# Patient Record
Sex: Female | Born: 2000
Health system: Southern US, Community
[De-identification: ages and names within clinical notes are randomized; demographics above are authoritative.]

## PROBLEM LIST (undated history)

## (undated) ENCOUNTER — Inpatient Hospital Stay (HOSPITAL_COMMUNITY): Payer: Self-pay

## (undated) DIAGNOSIS — Z8774 Personal history of (corrected) congenital malformations of heart and circulatory system: Secondary | ICD-10-CM

## (undated) DIAGNOSIS — R011 Cardiac murmur, unspecified: Secondary | ICD-10-CM

## (undated) DIAGNOSIS — R519 Headache, unspecified: Secondary | ICD-10-CM

## (undated) DIAGNOSIS — E119 Type 2 diabetes mellitus without complications: Secondary | ICD-10-CM

## (undated) HISTORY — PX: VSD REPAIR: SHX276

## (undated) HISTORY — PX: HEART CHAMBER REVISION: SHX267

## (undated) HISTORY — DX: Type 2 diabetes mellitus without complications: E11.9

## (undated) HISTORY — PX: OVARIAN CYST REMOVAL: SHX89

## (undated) HISTORY — DX: Personal history of (corrected) congenital malformations of heart and circulatory system: Z87.74

---
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
## 2018-03-25 DIAGNOSIS — R112 Nausea with vomiting, unspecified: Secondary | ICD-10-CM | POA: Diagnosis not present

## 2018-03-25 DIAGNOSIS — R51 Headache: Secondary | ICD-10-CM | POA: Diagnosis not present

## 2018-06-12 DIAGNOSIS — J069 Acute upper respiratory infection, unspecified: Secondary | ICD-10-CM | POA: Diagnosis not present

## 2018-10-21 DIAGNOSIS — R51 Headache: Secondary | ICD-10-CM | POA: Diagnosis not present

## 2018-10-21 DIAGNOSIS — R42 Dizziness and giddiness: Secondary | ICD-10-CM | POA: Diagnosis not present

## 2019-05-06 DIAGNOSIS — N898 Other specified noninflammatory disorders of vagina: Secondary | ICD-10-CM | POA: Diagnosis not present

## 2019-06-20 DIAGNOSIS — G43909 Migraine, unspecified, not intractable, without status migrainosus: Secondary | ICD-10-CM | POA: Diagnosis not present

## 2019-07-24 DIAGNOSIS — N912 Amenorrhea, unspecified: Secondary | ICD-10-CM | POA: Diagnosis not present

## 2019-08-23 DIAGNOSIS — N912 Amenorrhea, unspecified: Secondary | ICD-10-CM | POA: Diagnosis not present

## 2019-08-23 DIAGNOSIS — Z3201 Encounter for pregnancy test, result positive: Secondary | ICD-10-CM | POA: Diagnosis not present

## 2019-08-23 DIAGNOSIS — N83201 Unspecified ovarian cyst, right side: Secondary | ICD-10-CM | POA: Diagnosis not present

## 2019-08-25 DIAGNOSIS — Z3201 Encounter for pregnancy test, result positive: Secondary | ICD-10-CM | POA: Diagnosis not present

## 2019-08-28 ENCOUNTER — Other Ambulatory Visit: Payer: Self-pay

## 2019-08-28 ENCOUNTER — Inpatient Hospital Stay (HOSPITAL_COMMUNITY): Payer: BC Managed Care – PPO

## 2019-08-28 ENCOUNTER — Encounter (HOSPITAL_COMMUNITY): Payer: Self-pay | Admitting: Obstetrics and Gynecology

## 2019-08-28 ENCOUNTER — Inpatient Hospital Stay (HOSPITAL_COMMUNITY)
Admission: AD | Admit: 2019-08-28 | Discharge: 2019-08-28 | Disposition: A | Discharge status: Home / Self Care | Payer: BC Managed Care – PPO | Attending: Obstetrics and Gynecology | Admitting: Obstetrics and Gynecology

## 2019-08-28 DIAGNOSIS — N83202 Unspecified ovarian cyst, left side: Secondary | ICD-10-CM | POA: Insufficient documentation

## 2019-08-28 DIAGNOSIS — Z3A00 Weeks of gestation of pregnancy not specified: Secondary | ICD-10-CM | POA: Diagnosis not present

## 2019-08-28 DIAGNOSIS — O3680X0 Pregnancy with inconclusive fetal viability, not applicable or unspecified: Secondary | ICD-10-CM | POA: Diagnosis not present

## 2019-08-28 DIAGNOSIS — O200 Threatened abortion: Secondary | ICD-10-CM | POA: Insufficient documentation

## 2019-08-28 DIAGNOSIS — Z3A01 Less than 8 weeks gestation of pregnancy: Secondary | ICD-10-CM | POA: Diagnosis not present

## 2019-08-28 DIAGNOSIS — O209 Hemorrhage in early pregnancy, unspecified: Secondary | ICD-10-CM | POA: Diagnosis not present

## 2019-08-28 DIAGNOSIS — O99891 Other specified diseases and conditions complicating pregnancy: Secondary | ICD-10-CM | POA: Diagnosis not present

## 2019-08-28 DIAGNOSIS — Z3A09 9 weeks gestation of pregnancy: Secondary | ICD-10-CM | POA: Diagnosis not present

## 2019-08-28 HISTORY — DX: Cardiac murmur, unspecified: R01.1

## 2019-08-28 HISTORY — DX: Headache, unspecified: R51.9

## 2019-08-28 NOTE — ED Provider Notes (Signed)
MSE was initiated and I personally evaluated the patient and placed orders (if any) at  2:50 PM on August 28, 2019.  Shirley Gutierrez is a 19 y.o. female, presenting to the ED with vaginal bleeding for the last 5 days in the setting of pregnancy.  She states she has been changing a pad a few times a day but only when she goes to use the bathroom.   Accompanied by nausea. LMP April 17.  Followed by [facility] here in [place]. Denies fever, abdominal pain, vomiting, diarrhea, urinary symptoms.   Physical Exam:  No diaphoresis.  No pallor.  Pulmonary: No increased work of breathing.  Speaks in full sentences without difficulty. No tachypnea.  Cardiac: Normal rate and regular.   Abdominal: No abdominal tenderness.  No peritoneal signs.  No rebound tenderness.  No guarding.     2:54 PM: Spoke with Misty Stanley, CNM at MAU.  Discussed patient's symptoms.  States patient can come to the MAU.  Vitals:   08/28/19 1452  BP: 138/73  Pulse: 92  Resp: 14  Temp: 98.2 F (36.8 C)  TempSrc: Oral  SpO2: 100%  Weight: 83.5 kg  Height: 5\' 1"  (1.549 m)     The patient appears stable so that the remainder of the MSE may be completed by another provider.   08/28/19 1514    08/30/19, MD 08/28/19 [phone]

## 2019-08-28 NOTE — Discharge Instructions (Signed)
Return to MAU with severe vaginal bleeding or severe abdominal pain.  Advised to return with worsening symptoms  Pelvic rest - no tampons, no douching, no sex, nothing in the vagina. No heavy lifting or strenuous exercise. See your doctor as scheduled.

## 2019-08-28 NOTE — MAU Provider Note (Signed)
History     CSN: [id number]  Arrival date and time: 08/28/19 1433   First Provider Initiated Contact with Patient 08/28/19 1550      Chief Complaint  Patient presents with   Vaginal Bleeding   HPI Shirley Gutierrez 19 y.o. [redacted]w[redacted]d Comes to MAU with brown bleeding for the past 5 days.   Gets her prenatal care in [place].  Partner lives in [place] and she attends college in [place].  Comes here today as the bleeding has worsened and she is concerned.  Labs checked on her phone - O positive blood type verified.    OB History    Gravida  1   Para      Term      Preterm      AB      Living        SAB      TAB      Ectopic      Multiple      Live Births              Past Medical History:  Diagnosis Date   Headache    Heart murmur    when she was a child      No family history on file.  Social History   Tobacco Use   Smoking status: Never Smoker   Smokeless tobacco: Never Used  Substance Use Topics   Alcohol use: Never   Drug use: Never    Allergies:  Allergies  Allergen Reactions   Peanut-Containing Drug Products Other (See Comments)    No medications prior to admission.    Review of Systems  Constitutional: Negative for fever.  Respiratory: Negative for cough, shortness of breath and wheezing.   Gastrointestinal: Negative for abdominal pain, nausea and vomiting.  Genitourinary: Positive for vaginal bleeding. Negative for dysuria and vaginal discharge.   Physical Exam   Blood pressure 126/87, pulse 90, temperature 98.6 F (37 C), temperature source Oral, resp. rate 16, height 5\' 1"  (1.549 m), weight 84.2 kg, last menstrual period 06/25/2019, SpO2 98 %.  Physical Exam  Nursing note and vitals reviewed. Constitutional: She is oriented to person, place, and time. She appears well-developed.  HENT:  Head: Normocephalic.  GI: Soft.  Musculoskeletal:        General: Normal range of motion.     Cervical back: Neck supple.   Neurological: She is alert and oriented to person, place, and time.  Skin: Skin is warm and dry.    MAU Course  Procedures CLINICAL DATA:  Vaginal bleeding.  EXAM: OBSTETRIC <14 WK 06/27/2019 AND TRANSVAGINAL OB US  TECHNIQUE: Both transabdominal and transvaginal ultrasound examinations were performed for complete evaluation of the gestation as well as the maternal uterus, adnexal regions, and pelvic cul-de-sac. Transvaginal technique was performed to assess early pregnancy.  COMPARISON:  None.  FINDINGS: Intrauterine gestational sac: Probable early gestational sac.  Yolk sac:  Not Visualized.  Embryo:  Not Visualized.  MSD: 7.43 mm mm   5 w   3 d  Subchorionic hemorrhage:  None visualized.  Maternal uterus/adnexae: Normal right ovary. There is a mass in the left adnexa which contains solid and cystic components measuring 7.4 x 5.7 x 6 cm. The mass is very heterogeneous in echotexture with regions of shadowing identified.  IMPRESSION: 1. A rounded fluid collection in the endometrium is favored to represent an early gestational sac. No yolk sac or fetal pole identified. Probable early intrauterine gestational sac, but no yolk  sac, fetal pole, or cardiac activity yet visualized. Recommend follow-up quantitative B-HCG levels and follow-up US in 14 days to assess viability. This recommendation follows SRU consensus guidelines: Diagnostic Criteria for Nonviable Pregnancy Early in the First Trimester. [place] Med 2013; 098:1191-47. 2. There is a heterogeneous solid and cystic mass in the left adnexa measuring 7.4 x 5.7 x 6 cm. The mass likely arises within the left ovary and a separate left ovary is not seen. This mass is favored to represent a dermoid given the heterogeneous appearance with probable fat and calcifications in the mass.  MDM Today's [place] results are similar to what client verbalized from her [place] in the office earlier this month.  Still no fetal  pole.  Assessment and Plan  Vaginal bleeding - early developing pregnancy vs pending miscarriage Ultrasound inconclusive Threatened miscarriage Ovarian cyst, left  Plan Follow up with provider in [place]. If bleeding worsens, likely will be a miscarriage O positive blood type  Virginia Rochester 08/28/2019, 5:12 PM

## 2019-08-28 NOTE — MAU Note (Signed)
Shirley Gutierrez is a 19 y.o. at [redacted]w[redacted]d here in MAU reporting: brown bleeding for the past 5 days and last night it was red and heavier. No pain or abnormal discharge.   Has been seen at [facility] and had hcg drawn on 08/23/2019 and it was 1009. Pt able to show this to RN via patient portal. Has had an u/s with gestational sac but states it was empty.  LMP: 06/25/19  Onset of complaint: ongoing  Pain score: 0/10  Vitals:   08/28/19 1452 08/28/19 1525  BP: 138/73 126/87  Pulse: 92 90  Resp: 14 16  Temp: 98.2 F (36.8 C) 98.6 F (37 C)  SpO2: 100% 98%     Lab orders placed from triage: UA, pt unable to give sample at this time

## 2019-08-29 ENCOUNTER — Other Ambulatory Visit: Payer: Self-pay

## 2019-08-29 ENCOUNTER — Encounter (HOSPITAL_COMMUNITY): Payer: Self-pay | Admitting: Obstetrics & Gynecology

## 2019-08-29 ENCOUNTER — Inpatient Hospital Stay (HOSPITAL_COMMUNITY): Payer: BC Managed Care – PPO

## 2019-08-29 ENCOUNTER — Inpatient Hospital Stay (HOSPITAL_COMMUNITY)
Admission: AD | Admit: 2019-08-29 | Discharge: 2019-08-29 | Disposition: A | Discharge status: Home / Self Care | Payer: BC Managed Care – PPO | Attending: Obstetrics & Gynecology | Admitting: Obstetrics & Gynecology

## 2019-08-29 DIAGNOSIS — N83209 Unspecified ovarian cyst, unspecified side: Secondary | ICD-10-CM

## 2019-08-29 DIAGNOSIS — N83202 Unspecified ovarian cyst, left side: Secondary | ICD-10-CM

## 2019-08-29 DIAGNOSIS — O039 Complete or unspecified spontaneous abortion without complication: Secondary | ICD-10-CM | POA: Diagnosis not present

## 2019-08-29 DIAGNOSIS — Z3A09 9 weeks gestation of pregnancy: Secondary | ICD-10-CM | POA: Diagnosis not present

## 2019-08-29 DIAGNOSIS — O26891 Other specified pregnancy related conditions, first trimester: Secondary | ICD-10-CM

## 2019-08-29 DIAGNOSIS — O209 Hemorrhage in early pregnancy, unspecified: Secondary | ICD-10-CM | POA: Diagnosis not present

## 2019-08-29 LAB — CBC WITH DIFFERENTIAL/PLATELET
Abs Immature Granulocytes: 0.04 10*3/uL (ref 0.00–0.07)
Basophils Absolute: 0 10*3/uL (ref 0.0–0.1)
Basophils Relative: 0 %
Eosinophils Absolute: 0.3 10*3/uL (ref 0.0–0.5)
Eosinophils Relative: 4 %
HCT: 39.7 % (ref 36.0–46.0)
Hemoglobin: 12.7 g/dL (ref 12.0–15.0)
Immature Granulocytes: 1 %
Lymphocytes Relative: 12 %
Lymphs Abs: 0.8 10*3/uL (ref 0.7–4.0)
MCH: 27.8 pg (ref 26.0–34.0)
MCHC: 32 g/dL (ref 30.0–36.0)
MCV: 86.9 fL (ref 80.0–100.0)
Monocytes Absolute: 0.4 10*3/uL (ref 0.1–1.0)
Monocytes Relative: 5 %
Neutro Abs: 5.5 10*3/uL (ref 1.7–7.7)
Neutrophils Relative %: 78 %
Platelets: 248 10*3/uL (ref 150–400)
RBC: 4.57 MIL/uL (ref 3.87–5.11)
RDW: 14.6 % (ref 11.5–15.5)
WBC: 7.1 10*3/uL (ref 4.0–10.5)
nRBC: 0 % (ref 0.0–0.2)

## 2019-08-29 LAB — ABO/RH: ABO/RH(D): O POS

## 2019-08-29 LAB — HCG, QUANTITATIVE, PREGNANCY: hCG, Beta Chain, Quant, S: 649 m[IU]/mL — ABNORMAL HIGH (ref <5)

## 2019-08-29 MED ORDER — TRAMADOL HCL 50 MG PO TABS: 50.0000 mg | ORAL_TABLET | Freq: Four times a day (QID) | ORAL | 0 refills | Status: AC | PRN

## 2019-08-29 MED ORDER — HYDROMORPHONE HCL 1 MG/ML IJ SOLN
1.0000 mg | Freq: Once | INTRAMUSCULAR | Status: AC
  Administered 2019-08-29: 1 mg via INTRAMUSCULAR
  Filled 2019-08-29: qty 1

## 2019-08-29 NOTE — MAU Provider Note (Signed)
Chief Complaint: Abdominal Pain and Vaginal Bleeding   First Provider Initiated Contact with Patient 08/29/19 2008     SUBJECTIVE HPI: Shirley Gutierrez is a 19 y.o. G1P0 at [redacted]w[redacted]d who presents to Maternity Admissions reporting abdominal pain and vaginal bleeding.  Was seen in MAU yesterday and returned today for worsening symptoms.  States she was having no pain yesterday but the pain started this morning.  Was receiving care in [place] and has records in her phone.  Had an hCG on June 15 of 1009, was drawn again on the 17th but does not have the results for that yet.  Had an ultrasound done on June 17 that showed an intrauterine gestational sac with a yolk sac and a large dermoid cyst on her right ovary.  States she has filled 2 pads today, is not saturating pads per hour and is not passing clots.  Location: abdomen Quality: cramping Severity: 8/10 on pain scale Duration: <1 day Timing: intermittent Modifying factors: none Associated signs and symptoms: vaginal bleeding  Past Medical History:  Diagnosis Date  . Headache   . Heart murmur    when she was a child   OB History  Gravida Para Term Preterm AB Living  1            SAB TAB Ectopic Multiple Live Births               # Outcome Date GA Lbr Len/2nd Weight Sex Delivery Anes PTL Lv  1 Current            Past Surgical History:  Procedure Laterality Date  . HEART CHAMBER REVISION     closed hole in heart when seh was 2 months old   Social History   Socioeconomic History  . Marital status: Soil scientist    Spouse name: Not on file  . Number of children: Not on file  . Years of education: Not on file  . Highest education level: Not on file  Occupational History  . Not on file  Tobacco Use  . Smoking status: Never Smoker  . Smokeless tobacco: Never Used  Substance and Sexual Activity  . Alcohol use: Never  . Drug use: Never  . Sexual activity: Not on file  Other Topics Concern  . Not on file  Social History  Narrative  . Not on file   Social Determinants of Health   Financial Resource Strain:   . Difficulty of Paying Living Expenses:   Food Insecurity:   . Worried About Charity fundraiser in the Last Year:   . Arboriculturist in the Last Year:   Transportation Needs:   . Film/video editor (Medical):   Marland Kitchen Lack of Transportation (Non-Medical):   Physical Activity:   . Days of Exercise per Week:   . Minutes of Exercise per Session:   Stress:   . Feeling of Stress :   Social Connections:   . Frequency of Communication with Friends and Family:   . Frequency of Social Gatherings with Friends and Family:   . Attends Religious Services:   . Active Member of Clubs or Organizations:   . Attends Archivist Meetings:   Marland Kitchen Marital Status:   Intimate Partner Violence:   . Fear of Current or Ex-Partner:   . Emotionally Abused:   Marland Kitchen Physically Abused:   . Sexually Abused:    History reviewed. No pertinent family history. No current facility-administered medications on file prior to encounter.  Current Outpatient Medications on File Prior to Encounter  Medication Sig Dispense Refill  . ondansetron (ZOFRAN-ODT) 4 MG disintegrating tablet Take by mouth.     Allergies  Allergen Reactions  . Peanut-Containing Drug Products Other (See Comments)    I have reviewed patient's Past Medical Hx, Surgical Hx, Family Hx, Social Hx, medications and allergies.   Review of Systems  Constitutional: Negative.   Gastrointestinal: Positive for abdominal pain.  Genitourinary: Positive for vaginal bleeding.    OBJECTIVE Patient Vitals for the past 24 hrs:  BP Temp Temp src Pulse Resp Height Weight  08/29/19 2236 (!) 128/54 -- -- 76 -- -- --  08/29/19 1950 135/64 98.7 F (37.1 C) Oral 74 20 5\' 1"  (1.549 m) 83.6 kg   Constitutional: Well-developed, well-nourished female in no acute distress.  Cardiovascular: normal rate & rhythm, no murmur Respiratory: normal rate and effort. Lung sounds  clear throughout GI: Abd soft, non-tender, Pos BS x 4. No guarding or rebound tenderness MS: Extremities nontender, no edema, normal ROM Neurologic: Alert and oriented x 4.      LAB RESULTS Results for orders placed or performed during the hospital encounter of 08/29/19 (from the past 24 hour(s))  CBC with Differential/Platelet     Status: None   Collection Time: 08/29/19  8:34 PM  Result Value Ref Range   WBC 7.1 4.0 - 10.5 K/uL   RBC 4.57 3.87 - 5.11 MIL/uL   Hemoglobin 12.7 12.0 - 15.0 g/dL   HCT 08/31/19 36 - 46 %   MCV 86.9 80.0 - 100.0 fL   MCH 27.8 26.0 - 34.0 pg   MCHC 32.0 30.0 - 36.0 g/dL   RDW 29.4 76.5 - 46.5 %   Platelets 248 150 - 400 K/uL   nRBC 0.0 0.0 - 0.2 %   Neutrophils Relative % 78 %   Neutro Abs 5.5 1.7 - 7.7 K/uL   Lymphocytes Relative 12 %   Lymphs Abs 0.8 0.7 - 4.0 K/uL   Monocytes Relative 5 %   Monocytes Absolute 0.4 0 - 1 K/uL   Eosinophils Relative 4 %   Eosinophils Absolute 0.3 0 - 0 K/uL   Basophils Relative 0 %   Basophils Absolute 0.0 0 - 0 K/uL   Immature Granulocytes 1 %   Abs Immature Granulocytes 0.04 0.00 - 0.07 K/uL  hCG, quantitative, pregnancy     Status: Abnormal   Collection Time: 08/29/19  8:34 PM  Result Value Ref Range   hCG, Beta Chain, Quant, S 649 (H) <5 mIU/mL  ABO/Rh     Status: None   Collection Time: 08/29/19  8:34 PM  Result Value Ref Range   ABO/RH(D) O POS    No rh immune globuloin      NOT A RH IMMUNE GLOBULIN CANDIDATE, PT RH POSITIVE Performed at [facility], [address] [place], [place]     IMAGING [id number] US Transvaginal  Result Date: 08/29/2019 CLINICAL DATA:  19 year old pregnant female with vaginal bleeding. LMP: 06/25/2019 corresponding to an estimated gestational age of [redacted] weeks, 2 days. EXAM: OBSTETRIC <14 WK 06/27/2019 AND TRANSVAGINAL OB US DOPPLER ULTRASOUND OF OVARIES TECHNIQUE: Both transabdominal and transvaginal ultrasound examinations were performed for complete evaluation of the gestation  as well as the maternal uterus, adnexal regions, and pelvic cul-de-sac. Transvaginal technique was performed to assess early pregnancy. Color and duplex Doppler ultrasound was utilized to evaluate blood flow to the ovaries. COMPARISON:  Ultrasound dated 08/28/2019. FINDINGS: The uterus is  anteverted appears unremarkable. The endometrium measures approximately 7 mm in thickness. No intrauterine pregnancy identified. The previously seen sac-like structure within the endometrium is no longer present. The right ovary is unremarkable and measures 3.7 x 2.5 x 2.9 cm. Doppler images demonstrate flow to the right ovary. The left ovary is not identified with certainty. There is a 6.9 x 4.9 x 6.9 cm complex mass with solid and multi-septated cystic component in the region of the left ovary. This mass is not characterized but may represent an ovarian lesion such as a dermoid. An ectopic pregnancy is not excluded. Clinical correlation and obstetrical consult is advised. Arterial and venous flow noted within this mass. IMPRESSION: 1. No intrauterine pregnancy identified. Findings likely represent recent spontaneous abortion. 2. Complex mass in the region of the left adnexa as seen on the prior ultrasound. Clinical correlation is recommended. Electronically Signed   By: Elgie Collard M.D.   On: 08/29/2019 22:00   US PELVIC DOPPLER (TORSION R/O OR MASS ARTERIAL FLOW)  Result Date: 08/29/2019 CLINICAL DATA:  19 year old pregnant female with vaginal bleeding. LMP: 06/25/2019 corresponding to an estimated gestational age of [redacted] weeks, 2 days. EXAM: OBSTETRIC <14 WK [place] AND TRANSVAGINAL OB US DOPPLER ULTRASOUND OF OVARIES TECHNIQUE: Both transabdominal and transvaginal ultrasound examinations were performed for complete evaluation of the gestation as well as the maternal uterus, adnexal regions, and pelvic cul-de-sac. Transvaginal technique was performed to assess early pregnancy. Color and duplex Doppler ultrasound was utilized  to evaluate blood flow to the ovaries. COMPARISON:  Ultrasound dated 08/28/2019. FINDINGS: The uterus is anteverted appears unremarkable. The endometrium measures approximately 7 mm in thickness. No intrauterine pregnancy identified. The previously seen sac-like structure within the endometrium is no longer present. The right ovary is unremarkable and measures 3.7 x 2.5 x 2.9 cm. Doppler images demonstrate flow to the right ovary. The left ovary is not identified with certainty. There is a 6.9 x 4.9 x 6.9 cm complex mass with solid and multi-septated cystic component in the region of the left ovary. This mass is not characterized but may represent an ovarian lesion such as a dermoid. An ectopic pregnancy is not excluded. Clinical correlation and obstetrical consult is advised. Arterial and venous flow noted within this mass. IMPRESSION: 1. No intrauterine pregnancy identified. Findings likely represent recent spontaneous abortion. 2. Complex mass in the region of the left adnexa as seen on the prior ultrasound. Clinical correlation is recommended. Electronically Signed   By: Elgie Collard M.D.   On: 08/29/2019 22:00    MAU COURSE Orders Placed This Encounter  Procedures  . US OB Transvaginal  . US PELVIC DOPPLER (TORSION R/O OR MASS ARTERIAL FLOW)  . CBC with Differential/Platelet  . hCG, quantitative, pregnancy  . Diet NPO time specified  . ABO/Rh  . Discharge patient   Meds ordered this encounter  Medications  . HYDROmorphone (DILAUDID) injection 1 mg  . traMADol (ULTRAM) 50 MG tablet    Sig: Take 1 tablet (50 mg total) by mouth every 6 (six) hours as needed for up to 3 days for severe pain.    Dispense:  12 tablet    Refill:  0    Order Specific Question:   Supervising Provider    Answer:   Adam Phenix [phone]    MDM Patient visibly uncomfortable while in room. Given dilaudid 1 mg IM which helped with pain.  Pt has EMR app on her phone with results from her ob in [place].  Ultrasound on  6/15 shows IUGS with yolk sac. Ultrasound done in MAU yesterday shows empty IUGS. Discussed this likely means miscarriage. Will repeat ultrasound today to confirm miscarriage & to assess ovarian mass to r/o torsion.   Ultrasound today shows no gestation sac confirming miscarriage. Pt is RH positive.  Ovarian mass stable & no sign of torsion at this time.   Patient states she has appointment with a specialist on Wednesday regarding treatment of her cyst. She does not want to go back to [facility] for miscarriage follow up due to bad experience. Will get follow up in our office.   ASSESSMENT 1. Miscarriage   2. Ovarian cyst affecting pregnancy in first trimester, antepartum   3. Abdominal pain during pregnancy in first trimester     PLAN Discharge home in stable condition. Bleeding, infection, & ovarian torsion precautions given Rx tramadol prn #12 Msg to [facility] for follow up   Follow-up Information    [facility] Follow up.   Specialty: Obstetrics and Gynecology Why: the office will call you to schedule a follow up appointment Contact information: [address] [place] [address] [phone]             Allergies as of 08/29/2019      Reactions   Peanut-containing Drug Products Other (See Comments)      Medication List    TAKE these medications   ondansetron 4 MG disintegrating tablet Commonly known as: ZOFRAN-ODT Take by mouth.   traMADol 50 MG tablet Commonly known as: ULTRAM Take 1 tablet (50 mg total) by mouth every 6 (six) hours as needed for up to 3 days for severe pain.        Judeth Horn, NP 08/29/2019  10:50 PM

## 2019-08-29 NOTE — MAU Note (Signed)
PT SAYS HAS RED VAG BLEEDING SINCE  0700. HAS SOAKED 2 PADS AT HOME.  PAD ON IN TRIAGE -  1 THIN RED STREAK OF BLOOD. WAS ALSO HERE YESTERDAY FOR BLEEDING. - WORSE NOW.  NO CRAMPS YESTERDAY-  CRAMPS STARTED AT 1PM.  NO MEDS .

## 2019-08-29 NOTE — Discharge Instructions (Signed)
Return to care  °· If you have heavier bleeding that soaks through more that 2 pads per hour for an hour or more °· If you bleed so much that you feel like you might pass out or you do pass out °· If you have significant abdominal pain that is not improved with Tylenol  °· If you develop a fever > 100.5 ° ° °Miscarriage °A miscarriage is the loss of an unborn baby (fetus) before the 20th week of pregnancy. Most miscarriages happen during the first 3 months of pregnancy. Sometimes, a miscarriage can happen before a woman knows that she is pregnant. °Having a miscarriage can be an emotional experience. If you have had a miscarriage, talk with your health care provider about any questions you may have about miscarrying, the grieving process, and your plans for future pregnancy. °What are the causes? °A miscarriage may be caused by: °· Problems with the genes or chromosomes of the fetus. These problems make it impossible for the baby to develop normally. They are often the result of random errors that occur early in the development of the baby, and are not passed from parent to child (not inherited). °· Infection of the cervix or uterus. °· Conditions that affect hormone balance in the body. °· Problems with the cervix, such as the cervix opening and thinning before pregnancy is at term (cervical insufficiency). °· Problems with the uterus. These may include: °? A uterus with an abnormal shape. °? Fibroids in the uterus. °? Congenital abnormalities. These are problems that were present at birth. °· Certain medical conditions. °· Smoking, drinking alcohol, or using drugs. °· Injury (trauma). °In many cases, the cause of a miscarriage is not known. °What are the signs or symptoms? °Symptoms of this condition include: °· Vaginal bleeding or spotting, with or without cramps or pain. °· Pain or cramping in the abdomen or lower back. °· Passing fluid, tissue, or blood clots from the vagina. °How is this diagnosed? °This  condition may be diagnosed based on: °· A physical exam. °· Ultrasound. °· Blood tests. °· Urine tests. °How is this treated? °Treatment for a miscarriage is sometimes not necessary if you naturally pass all the tissue that was in your uterus. If necessary, this condition may be treated with: °· Dilation and curettage (D&C). This is a procedure in which the cervix is stretched open and the lining of the uterus (endometrium) is scraped. This is done only if tissue from the fetus or placenta remains in the body (incomplete miscarriage). °· Medicines, such as: °? Antibiotic medicine, to treat infection. °? Medicine to help the body pass any remaining tissue. °? Medicine to reduce (contract) the size of the uterus. These medicines may be given if you have a lot of bleeding. °If you have Rh negative blood and your baby was Rh positive, you will need a shot of a medicine called Rh immunoglobulinto protect your future babies from Rh blood problems. "Rh-negative" and "Rh-positive" refer to whether or not the blood has a specific protein found on the surface of red blood cells (Rh factor). °Follow these instructions at home: °Medicines ° °· Take over-the-counter and prescription medicines only as told by your health care provider. °· If you were prescribed antibiotic medicine, take it as told by your health care provider. Do not stop taking the antibiotic even if you start to feel better. °· Do not take NSAIDs, such as aspirin and ibuprofen, unless they are approved by your health care provider. These   medicines can cause bleeding. °Activity °· Rest as directed. Ask your health care provider what activities are safe for you. °· Have someone help with home and family responsibilities during this time. °General instructions °· Keep track of the number of sanitary pads you use each day and how soaked (saturated) they are. Write down this information. °· Monitor the amount of tissue or blood clots that you pass from your vagina.  Save any large amounts of tissue for your health care provider to examine. °· Do not use tampons, douche, or have sex until your health care provider approves. °· To help you and your partner with the process of grieving, talk with your health care provider or seek counseling. °· When you are ready, meet with your health care provider to discuss any important steps you should take for your health. Also, discuss steps you should take to have a healthy pregnancy in the future. °· Keep all follow-up visits as told by your health care provider. This is important. °Where to find more information °· The American Congress of Obstetricians and Gynecologists: www.acog.org °· [place] Department of Health and Human Services Office of Women’s Health: www.womenshealth.gov °Contact a health care provider if: °· You have a fever or chills. °· You have a foul smelling vaginal discharge. °· You have more bleeding instead of less. °Get help right away if: °· You have severe cramps or pain in your back or abdomen. °· You pass blood clots or tissue from your vagina that is walnut-sized or larger. °· You soak more than 1 regular sanitary pad in an hour. °· You become light-headed or weak. °· You pass out. °· You have feelings of sadness that take over your thoughts, or you have thoughts of hurting yourself. °Summary °· Most miscarriages happen in the first 3 months of pregnancy. Sometimes miscarriage happens before a woman even knows that she is pregnant. °· Follow your health care provider's instruction for home care. Keep all follow-up appointments. °· To help you and your partner with the process of grieving, talk with your health care provider or seek counseling. °This information is not intended to replace advice given to you by your health care provider. Make sure you discuss any questions you have with your health care provider. °Document Revised: 06/18/2018 Document Reviewed: 04/01/2016 °Elsevier Patient Education © 2020 Elsevier  Inc. ° °

## 2019-08-31 DIAGNOSIS — R1900 Intra-abdominal and pelvic swelling, mass and lump, unspecified site: Secondary | ICD-10-CM | POA: Diagnosis not present

## 2019-08-31 DIAGNOSIS — N83299 Other ovarian cyst, unspecified side: Secondary | ICD-10-CM | POA: Diagnosis not present

## 2019-08-31 DIAGNOSIS — Z6834 Body mass index (BMI) 34.0-34.9, adult: Secondary | ICD-10-CM | POA: Diagnosis not present

## 2019-09-09 DIAGNOSIS — Z01818 Encounter for other preprocedural examination: Secondary | ICD-10-CM | POA: Diagnosis not present

## 2019-09-13 DIAGNOSIS — N83299 Other ovarian cyst, unspecified side: Secondary | ICD-10-CM | POA: Diagnosis not present

## 2019-09-13 DIAGNOSIS — D271 Benign neoplasm of left ovary: Secondary | ICD-10-CM | POA: Diagnosis not present

## 2019-09-13 DIAGNOSIS — N83292 Other ovarian cyst, left side: Secondary | ICD-10-CM | POA: Diagnosis not present

## 2019-09-13 DIAGNOSIS — R1900 Intra-abdominal and pelvic swelling, mass and lump, unspecified site: Secondary | ICD-10-CM | POA: Diagnosis not present

## 2019-09-13 DIAGNOSIS — D279 Benign neoplasm of unspecified ovary: Secondary | ICD-10-CM | POA: Diagnosis not present

## 2019-09-22 ENCOUNTER — Ambulatory Visit (INDEPENDENT_AMBULATORY_CARE_PROVIDER_SITE_OTHER): Payer: BC Managed Care – PPO | Admitting: Obstetrics and Gynecology

## 2019-09-22 ENCOUNTER — Other Ambulatory Visit: Payer: Self-pay

## 2019-09-22 ENCOUNTER — Encounter: Payer: Self-pay | Admitting: Obstetrics and Gynecology

## 2019-09-22 VITALS — BP 130/61 | HR 77 | Ht 61.0 in | Wt 180.0 lb

## 2019-09-22 DIAGNOSIS — O039 Complete or unspecified spontaneous abortion without complication: Secondary | ICD-10-CM

## 2019-09-22 NOTE — Progress Notes (Signed)
Subjective:  Shirley Gutierrez is a 19 y.o. G1P0010 who presents today for FU BHCG. She was diagnosed with an SAB on 6/21. She does not have vaginal bleeding or pain at this time.  She is is not interested in trying for a pregnancy at this time. She does not want BC at this time. She had dermoid cyst removed on 7/6 with [facility]. The pathology showed benign.    Objective:  Physical Exam  Nursing note and vitals reviewed. Constitutional: She is oriented to person, place, and time. She appears well-developed and well-nourished. No distress.  HENT:  Head: Normocephalic.  Cardiovascular: Normal rate.  Respiratory: Effort normal.  Neurological: She is alert and oriented to person, place, and time. Skin: Skin is warm and dry.  Psychiatric: She has a normal mood and affect.   No results found for this or any previous visit (from the past 24 hour(s)).  Assessment/Plan:  SAB Last Quant on 6/21: 649 Repeat Quant today.  Venia Carbon I, NP 09/22/2019 1:21 PM

## 2019-09-23 LAB — BETA HCG QUANT (REF LAB): hCG Quant: <1 m[IU]/mL

## 2019-10-04 ENCOUNTER — Encounter: Payer: Self-pay | Admitting: Student

## 2019-11-30 ENCOUNTER — Ambulatory Visit: Payer: BC Managed Care – PPO

## 2019-12-01 DIAGNOSIS — R519 Headache, unspecified: Secondary | ICD-10-CM | POA: Diagnosis not present

## 2019-12-01 DIAGNOSIS — R112 Nausea with vomiting, unspecified: Secondary | ICD-10-CM | POA: Diagnosis not present

## 2019-12-14 ENCOUNTER — Ambulatory Visit: Payer: BC Managed Care – PPO | Admitting: Obstetrics and Gynecology

## 2020-05-01 DIAGNOSIS — N911 Secondary amenorrhea: Secondary | ICD-10-CM | POA: Diagnosis not present

## 2020-05-01 DIAGNOSIS — L293 Anogenital pruritus, unspecified: Secondary | ICD-10-CM | POA: Diagnosis not present

## 2020-05-01 DIAGNOSIS — Z3201 Encounter for pregnancy test, result positive: Secondary | ICD-10-CM | POA: Diagnosis not present

## 2020-05-16 ENCOUNTER — Encounter (HOSPITAL_COMMUNITY): Payer: Self-pay | Admitting: Obstetrics and Gynecology

## 2020-05-16 ENCOUNTER — Other Ambulatory Visit: Payer: Self-pay

## 2020-05-16 ENCOUNTER — Inpatient Hospital Stay (HOSPITAL_COMMUNITY)
Admission: AD | Admit: 2020-05-16 | Discharge: 2020-05-16 | Disposition: A | Discharge status: Home / Self Care | Payer: BC Managed Care – PPO | Attending: Obstetrics and Gynecology | Admitting: Obstetrics and Gynecology

## 2020-05-16 ENCOUNTER — Inpatient Hospital Stay (HOSPITAL_COMMUNITY): Payer: BC Managed Care – PPO

## 2020-05-16 DIAGNOSIS — R030 Elevated blood-pressure reading, without diagnosis of hypertension: Secondary | ICD-10-CM | POA: Insufficient documentation

## 2020-05-16 DIAGNOSIS — O4691 Antepartum hemorrhage, unspecified, first trimester: Secondary | ICD-10-CM | POA: Diagnosis not present

## 2020-05-16 DIAGNOSIS — Z3A09 9 weeks gestation of pregnancy: Secondary | ICD-10-CM | POA: Diagnosis not present

## 2020-05-16 DIAGNOSIS — Z3490 Encounter for supervision of normal pregnancy, unspecified, unspecified trimester: Secondary | ICD-10-CM

## 2020-05-16 DIAGNOSIS — O209 Hemorrhage in early pregnancy, unspecified: Secondary | ICD-10-CM | POA: Insufficient documentation

## 2020-05-16 DIAGNOSIS — O26891 Other specified pregnancy related conditions, first trimester: Secondary | ICD-10-CM | POA: Insufficient documentation

## 2020-05-16 LAB — URINALYSIS, ROUTINE W REFLEX MICROSCOPIC
Bilirubin Urine: NEGATIVE
Glucose, UA: NEGATIVE mg/dL
Ketones, ur: NEGATIVE mg/dL
Leukocytes,Ua: NEGATIVE
Nitrite: NEGATIVE
Protein, ur: NEGATIVE mg/dL
RBC / HPF: >50 RBC/hpf — ABNORMAL HIGH (ref 0–5)
Specific Gravity, Urine: 1.018 (ref 1.005–1.030)
pH: 7 (ref 5.0–8.0)

## 2020-05-16 LAB — CBC
HCT: 38.4 % (ref 36.0–46.0)
Hemoglobin: 13.1 g/dL (ref 12.0–15.0)
MCH: 29.2 pg (ref 26.0–34.0)
MCHC: 34.1 g/dL (ref 30.0–36.0)
MCV: 85.5 fL (ref 80.0–100.0)
Platelets: 297 10*3/uL (ref 150–400)
RBC: 4.49 MIL/uL (ref 3.87–5.11)
RDW: 14.9 % (ref 11.5–15.5)
WBC: 9.1 10*3/uL (ref 4.0–10.5)
nRBC: 0 % (ref 0.0–0.2)

## 2020-05-16 LAB — POCT PREGNANCY, URINE: Preg Test, Ur: POSITIVE — AB

## 2020-05-16 LAB — WET PREP, GENITAL
Clue Cells Wet Prep HPF POC: NONE SEEN
Sperm: NONE SEEN
Trich, Wet Prep: NONE SEEN
Yeast Wet Prep HPF POC: NONE SEEN

## 2020-05-16 LAB — HCG, QUANTITATIVE, PREGNANCY: hCG, Beta Chain, Quant, S: 63507 m[IU]/mL — ABNORMAL HIGH (ref <5)

## 2020-05-16 NOTE — Discharge Instructions (Signed)
Vaginal Bleeding During Pregnancy, First Trimester A small amount of bleeding from the vagina is common during early pregnancy. This kind of bleeding is also called spotting. Sometimes the bleeding is normal and does not cause problems. At other times, though, bleeding may be a sign of something serious. Normal bleeding in pregnancy can happen:  When the fertilized egg attaches itself to your womb.  When blood vessels change because of the pregnancy.  When you have pelvic exams.  When you have sex. Abnormal bleeding can happen:  When you have an infection.  When you have growths in your womb. The growths are called polyps.  If you are having a miscarriage or at risk of having one.  If you have other problems in your pregnancy. Tell your doctor right away about any bleeding from your vagina. Follow these instructions at home: Watch your bleeding  Watch your condition for any changes. Let your doctor know if you are worried about something.  Try to know what causes your bleeding. Ask yourself these questions: ? Does the bleeding start on its own? ? Does the bleeding start after something is done, such as sex or a pelvic exam?  Use a diary to write the things you see about your bleeding. Write in your diary: ? If the bleeding flows freely without stopping, or if it starts and stops, and then starts again. ? If the bleeding is heavy or light. ? How many pads you use in a day and how much blood is in them.  Tell your doctor if you pass tissue. He or she may want to see it.   Activity  Follow your doctor's instructions about how active you can be. Ask what activities are safe for you.  Do not have sex or orgasms until your doctor says that this is safe.  If needed, make plans for someone to help with your normal activities. General instructions  Take over-the-counter and prescription medicines only as told by your doctor.  Do not take aspirin because it can cause  bleeding.  Do not use tampons.  Do not douche.  Keep all follow-up visits. Contact a doctor if:  You have vaginal bleeding at any time while you are pregnant.  You have cramps.  You have a fever or chills. Get help right away if:  You have very bad cramps in your back or belly (abdomen).  You pass large clots or a lot of tissue from your vagina.  Your bleeding gets worse.  You feel light-headed.  You feel weak.  You pass out (faint).  You have chills.  You are leaking fluid from your vagina.  You have a gush of fluid from your vagina. Summary  Sometimes vaginal bleeding during pregnancy is normal and does not cause problems. At other times, bleeding may be a sign of something serious.  Tell your doctor right away about any bleeding from your vagina.  Follow your doctor's instructions about how active you can be. You may need someone to help you with your normal activities.  Keep all follow-up visits. This information is not intended to replace advice given to you by your health care provider. Make sure you discuss any questions you have with your health care provider. Document Revised: 11/17/2019 Document Reviewed: 11/17/2019 Elsevier Patient Education  2021 Elsevier Inc.  

## 2020-05-16 NOTE — MAU Note (Signed)
Presents with c/o VB that began today 1-2 hours ago.  Denies abdominal pain or cramping.  LMP 03/09/20

## 2020-05-16 NOTE — MAU Provider Note (Addendum)
History  [id number] Arrival date and time: 05/16/20 1507   Chief Complaint  Patient presents with  . Vaginal Bleeding    HPI Shirley Gutierrez is a 20 y.o. at [redacted]w[redacted]d by LMP who presents for vaginal bleeding. Patient urinated approximately 1.5 hrs ago and noticed blood on the toilet tissue at that time. On arrival here, she gave a urine sample and noticed some blood in the urine. Did not notice any clots. Is not wearing a pad, no bleeding into her undergarments. Denies any abdominal pain/cramping. Has had 1 prenatal visit at [facility] so far this pregnancy. States she had an ultrasound a few days ago at another outside location and everything was normal at that time. She is remote from sexual intercourse.  Vaginal bleeding: Yes LOF: No Fetal Movement: No Contractions: No  Review of outside prenatal records via [facility]where: Initial prenatal visit on 05/01/2020. Review of discharge summary from last admission on 08/29/2019: SAB at [redacted]w[redacted]d   --/--/O POS (06/21 2034)  OB History    Gravida  2   Para  0   Term  0   Preterm  0   AB  1   Living  0     SAB  1   IAB  0   Ectopic  0   Multiple  0   Live Births  0           Past Medical History:  Diagnosis Date  . Headache   . Heart murmur    when she was a child    Past Surgical History:  Procedure Laterality Date  . HEART CHAMBER REVISION     closed hole in heart when seh was 2 months old  . OVARIAN CYST REMOVAL      Family History  Problem Relation Age of Onset  . Diabetes Father     Social History   Socioeconomic History  . Marital status: Media planner    Spouse name: Not on file  . Number of children: Not on file  . Years of education: Not on file  . Highest education level: Not on file  Occupational History  . Not on file  Tobacco Use  . Smoking status: Never Smoker  . Smokeless tobacco: Never Used  Substance and Sexual Activity  . Alcohol use: Never  . Drug use: Never  .  Sexual activity: Yes  Other Topics Concern  . Not on file  Social History Narrative  . Not on file   Social Determinants of Health   Financial Resource Strain: Not on file  Food Insecurity: No Food Insecurity  . Worried About Programme researcher, broadcasting/film/video in the Last Year: Never true  . Ran Out of Food in the Last Year: Never true  Transportation Needs: No Transportation Needs  . Lack of Transportation (Medical): No  . Lack of Transportation (Non-Medical): No  Physical Activity: Not on file  Stress: Not on file  Social Connections: Not on file  Intimate Partner Violence: Not on file    Allergies  Allergen Reactions  . Peanut-Containing Drug Products Other (See Comments)    No current facility-administered medications on file prior to encounter.   No current outpatient medications on file prior to encounter.    ROS Pertinent positives and negative per HPI, all others reviewed and negative  Physical Exam   BP 134/70 (BP Location: Right Arm)   Pulse 86   Temp 98.1 F (36.7 C) (Oral)   Resp 18   Ht 5\' 1"  (  1.549 m)   Wt 87 kg   LMP 03/09/2020   SpO2 97%   BMI 36.26 kg/m   Physical Exam Gen: alert, well-appearing, NAD Abd: soft, nontender Pelvic exam deferred  Labs Results for orders placed or performed during the hospital encounter of 05/16/20 (from the past 24 hour(s))  Pregnancy, urine POC     Status: Abnormal   Collection Time: 05/16/20  3:26 PM  Result Value Ref Range   Preg Test, Ur POSITIVE (A) NEGATIVE  Urinalysis, Routine w reflex microscopic Urine, Clean Catch     Status: Abnormal   Collection Time: 05/16/20  3:33 PM  Result Value Ref Range   Color, Urine YELLOW YELLOW   APPearance CLOUDY (A) CLEAR   Specific Gravity, Urine 1.018 1.005 - 1.030   pH 7.0 5.0 - 8.0   Glucose, UA NEGATIVE NEGATIVE mg/dL   Hgb urine dipstick LARGE (A) NEGATIVE   Bilirubin Urine NEGATIVE NEGATIVE   Ketones, ur NEGATIVE NEGATIVE mg/dL   Protein, ur NEGATIVE NEGATIVE mg/dL    Nitrite NEGATIVE NEGATIVE   Leukocytes,Ua NEGATIVE NEGATIVE   RBC / HPF >50 (H) 0 - 5 RBC/hpf   WBC, UA 0-5 0 - 5 WBC/hpf   Bacteria, UA RARE (A) NONE SEEN   Squamous Epithelial / LPF 6-10 0 - 5  CBC     Status: None   Collection Time: 05/16/20  4:41 PM  Result Value Ref Range   WBC 9.1 4.0 - 10.5 K/uL   RBC 4.49 3.87 - 5.11 MIL/uL   Hemoglobin 13.1 12.0 - 15.0 g/dL   HCT 99.2 42.6 - 83.4 %   MCV 85.5 80.0 - 100.0 fL   MCH 29.2 26.0 - 34.0 pg   MCHC 34.1 30.0 - 36.0 g/dL   RDW 19.6 22.2 - 97.9 %   Platelets 297 150 - 400 K/uL   nRBC 0.0 0.0 - 0.2 %  hCG, quantitative, pregnancy     Status: Abnormal   Collection Time: 05/16/20  4:41 PM  Result Value Ref Range   hCG, Beta Chain, Quant, S 63,507 (H) <5 mIU/mL  Wet prep, genital     Status: Abnormal   Collection Time: 05/16/20  4:44 PM   Specimen: PATH Cytology Cervicovaginal Ancillary Only  Result Value Ref Range   Yeast Wet Prep HPF POC NONE SEEN NONE SEEN   Trich, Wet Prep NONE SEEN NONE SEEN   Clue Cells Wet Prep HPF POC NONE SEEN NONE SEEN   WBC, Wet Prep HPF POC MODERATE (A) NONE SEEN   Sperm NONE SEEN     Imaging US OB Comp Less 14 Wks  Result Date: 05/16/2020 CLINICAL DATA:  Vaginal bleeding EXAM: OBSTETRIC <14 WK ULTRASOUND TECHNIQUE: Transabdominal ultrasound was performed for evaluation of the gestation as well as the maternal uterus and adnexal regions. COMPARISON:  None. FINDINGS: Intrauterine gestational sac: Present Yolk sac:  Present Embryo:  Present Cardiac Activity: Present Heart Rate: 185 bpm CRL:   25.4 mm   9 w 1 d                  [place] EDC: 12/18/2020 Subchorionic hemorrhage:  None visualized. Maternal uterus/adnexae: Within normal limits. IMPRESSION: Single live intrauterine gestation at 9 weeks 1 day. No acute abnormality noted. Electronically Signed   By: Alcide Clever M.D.   On: 05/16/2020 17:04    MAU Course   Lab Orders     Wet prep, genital     Urinalysis, Routine w reflex microscopic Urine, Clean  Catch     CBC     hCG, quantitative, pregnancy     Pregnancy, urine POC No orders of the defined types were placed in this encounter.   Imaging Orders     US OB Comp Less 14 Wks  Assessment and Plan  Shirley Gutierrez is a 20 y.o. at [redacted]w[redacted]d by LMP who presents for vaginal bleeding. Patient with scant bleeding per history. Vitals stable.   Ultrasound revealed normal IUP at [redacted]w[redacted]d, which is c/w dates from LMP. HCG quant pending. CBC unremarkable. Wet prep wnl. GC/chlamydia pending.  -Discussed results with patient. All questions answered. -Discharge home -Routine f/u with OB -Continue PNV -Of note, patient had elevated BP (145/67) prior to discharge. Continue to monitor as an outpatient.   Maury Dus  Attestation of Supervision of Student:  I confirm that I have verified the information documented in the resident's note and that I have also personally reperformed the history, physical exam and all medical decision making activities.  I have verified that all services and findings are accurately documented in this student's note; and I agree with management and plan as outlined in the documentation. I have also made any necessary editorial changes.  Calvert Cantor, CNM [facility] [employer], [facility] 05/16/2020 7:02 PM

## 2020-05-17 LAB — GC/CHLAMYDIA PROBE AMP (~~LOC~~) NOT AT ARMC
Chlamydia: NEGATIVE
Comment: NEGATIVE
Comment: NORMAL
Neisseria Gonorrhea: NEGATIVE

## 2020-05-22 DIAGNOSIS — O26891 Other specified pregnancy related conditions, first trimester: Secondary | ICD-10-CM | POA: Diagnosis not present

## 2020-05-22 DIAGNOSIS — Z3689 Encounter for other specified antenatal screening: Secondary | ICD-10-CM | POA: Diagnosis not present

## 2020-05-22 DIAGNOSIS — Z1151 Encounter for screening for human papillomavirus (HPV): Secondary | ICD-10-CM | POA: Diagnosis not present

## 2020-05-22 DIAGNOSIS — Z113 Encounter for screening for infections with a predominantly sexual mode of transmission: Secondary | ICD-10-CM | POA: Diagnosis not present

## 2020-05-22 DIAGNOSIS — Z3A10 10 weeks gestation of pregnancy: Secondary | ICD-10-CM | POA: Diagnosis not present

## 2020-05-22 DIAGNOSIS — Z363 Encounter for antenatal screening for malformations: Secondary | ICD-10-CM | POA: Diagnosis not present

## 2020-05-22 DIAGNOSIS — Z124 Encounter for screening for malignant neoplasm of cervix: Secondary | ICD-10-CM | POA: Diagnosis not present

## 2020-05-22 LAB — OB RESULTS CONSOLE RPR: RPR: NONREACTIVE

## 2020-05-22 LAB — OB RESULTS CONSOLE HEPATITIS B SURFACE ANTIGEN: Hepatitis B Surface Ag: NEGATIVE

## 2020-05-22 LAB — OB RESULTS CONSOLE HIV ANTIBODY (ROUTINE TESTING): HIV: NONREACTIVE

## 2020-05-22 LAB — OB RESULTS CONSOLE RUBELLA ANTIBODY, IGM: Rubella: IMMUNE

## 2020-06-20 ENCOUNTER — Encounter (HOSPITAL_COMMUNITY): Payer: Self-pay | Admitting: Obstetrics & Gynecology

## 2020-06-20 ENCOUNTER — Other Ambulatory Visit: Payer: Self-pay

## 2020-06-20 ENCOUNTER — Inpatient Hospital Stay (HOSPITAL_COMMUNITY)
Admission: AD | Admit: 2020-06-20 | Discharge: 2020-06-20 | Disposition: A | Discharge status: Home / Self Care | Payer: BC Managed Care – PPO | Attending: Obstetrics & Gynecology | Admitting: Obstetrics & Gynecology

## 2020-06-20 DIAGNOSIS — J069 Acute upper respiratory infection, unspecified: Secondary | ICD-10-CM | POA: Diagnosis not present

## 2020-06-20 DIAGNOSIS — Z20822 Contact with and (suspected) exposure to covid-19: Secondary | ICD-10-CM | POA: Diagnosis not present

## 2020-06-20 DIAGNOSIS — O99512 Diseases of the respiratory system complicating pregnancy, second trimester: Secondary | ICD-10-CM

## 2020-06-20 DIAGNOSIS — A084 Viral intestinal infection, unspecified: Secondary | ICD-10-CM | POA: Diagnosis not present

## 2020-06-20 DIAGNOSIS — A09 Infectious gastroenteritis and colitis, unspecified: Secondary | ICD-10-CM | POA: Diagnosis not present

## 2020-06-20 DIAGNOSIS — Z3A14 14 weeks gestation of pregnancy: Secondary | ICD-10-CM | POA: Diagnosis not present

## 2020-06-20 DIAGNOSIS — J029 Acute pharyngitis, unspecified: Secondary | ICD-10-CM | POA: Diagnosis not present

## 2020-06-20 DIAGNOSIS — O99612 Diseases of the digestive system complicating pregnancy, second trimester: Secondary | ICD-10-CM | POA: Insufficient documentation

## 2020-06-20 DIAGNOSIS — O98512 Other viral diseases complicating pregnancy, second trimester: Secondary | ICD-10-CM | POA: Diagnosis not present

## 2020-06-20 LAB — CBC
HCT: 38.9 % (ref 36.0–46.0)
Hemoglobin: 12.9 g/dL (ref 12.0–15.0)
MCH: 28.5 pg (ref 26.0–34.0)
MCHC: 33.2 g/dL (ref 30.0–36.0)
MCV: 86.1 fL (ref 80.0–100.0)
Platelets: 283 10*3/uL (ref 150–400)
RBC: 4.52 MIL/uL (ref 3.87–5.11)
RDW: 14.7 % (ref 11.5–15.5)
WBC: 9.4 10*3/uL (ref 4.0–10.5)
nRBC: 0 % (ref 0.0–0.2)

## 2020-06-20 LAB — URINALYSIS, ROUTINE W REFLEX MICROSCOPIC
Bilirubin Urine: NEGATIVE
Glucose, UA: >=500 mg/dL — AB
Hgb urine dipstick: NEGATIVE
Ketones, ur: 5 mg/dL — AB
Leukocytes,Ua: NEGATIVE
Nitrite: NEGATIVE
Protein, ur: NEGATIVE mg/dL
Specific Gravity, Urine: 1.01 (ref 1.005–1.030)
pH: 7 (ref 5.0–8.0)

## 2020-06-20 LAB — COMPREHENSIVE METABOLIC PANEL
ALT: 92 U/L — ABNORMAL HIGH (ref 0–44)
AST: 62 U/L — ABNORMAL HIGH (ref 15–41)
Albumin: 3.5 g/dL (ref 3.5–5.0)
Alkaline Phosphatase: 60 U/L (ref 38–126)
Anion gap: 10 (ref 5–15)
BUN: <5 mg/dL — ABNORMAL LOW (ref 6–20)
CO2: 22 mmol/L (ref 22–32)
Calcium: 9.7 mg/dL (ref 8.9–10.3)
Chloride: 103 mmol/L (ref 98–111)
Creatinine, Ser: 0.53 mg/dL (ref 0.44–1.00)
GFR, Estimated: >60 mL/min (ref >60)
Glucose, Bld: 132 mg/dL — ABNORMAL HIGH (ref 70–99)
Potassium: 3.4 mmol/L — ABNORMAL LOW (ref 3.5–5.1)
Sodium: 135 mmol/L (ref 135–145)
Total Bilirubin: 0.5 mg/dL (ref 0.3–1.2)
Total Protein: 7.5 g/dL (ref 6.5–8.1)

## 2020-06-20 LAB — RESP PANEL BY RT-PCR (FLU A&B, COVID) ARPGX2
Influenza A by PCR: NEGATIVE
Influenza B by PCR: NEGATIVE
SARS Coronavirus 2 by RT PCR: NEGATIVE

## 2020-06-20 LAB — GROUP A STREP BY PCR: Group A Strep by PCR: NOT DETECTED

## 2020-06-20 MED ORDER — SODIUM CHLORIDE 0.9 % IV SOLN
12.5000 mg | Freq: Once | INTRAVENOUS | Status: AC
  Administered 2020-06-20: 12.5 mg via INTRAVENOUS
  Filled 2020-06-20: qty 0.5

## 2020-06-20 MED ORDER — PROMETHAZINE HCL 25 MG PO TABS: 12.5000 mg | ORAL_TABLET | Freq: Four times a day (QID) | ORAL | 0 refills | Status: DC | PRN

## 2020-06-20 MED ORDER — PSEUDOEPHEDRINE HCL 30 MG PO TABS
60.0000 mg | ORAL_TABLET | Freq: Once | ORAL | Status: AC
  Administered 2020-06-20: 60 mg via ORAL
  Filled 2020-06-20: qty 2

## 2020-06-20 MED ORDER — FAMOTIDINE IN NACL 20-0.9 MG/50ML-% IV SOLN
20.0000 mg | Freq: Once | INTRAVENOUS | Status: AC
  Administered 2020-06-20: 20 mg via INTRAVENOUS
  Filled 2020-06-20: qty 50

## 2020-06-20 NOTE — MAU Provider Note (Signed)
Chief Complaint: sore throat  and Headache   Event Date/Time   First Provider Initiated Contact with Patient 06/20/20 1421      SUBJECTIVE HPI: Shirley Gutierrez is a 20 y.o. G2P0010 at [redacted]w[redacted]d who presents to maternity admissions reporting onset of sore throat 3 days ago, headache, runny nose, and ear pain today.  She has associated drainage/postnasal drip which is causing nausea and vomiting.  She has had trouble keeping down fluids today.  She denies any sick contacts. There are no other associated symptoms.  She has not received her flu or COVID vaccines.   HPI  Past Medical History:  Diagnosis Date  . Headache   . Heart murmur    when she was a child   Past Surgical History:  Procedure Laterality Date  . HEART CHAMBER REVISION     closed hole in heart when seh was 2 months old  . OVARIAN CYST REMOVAL     Social History   Socioeconomic History  . Marital status: Media planner    Spouse name: Not on file  . Number of children: Not on file  . Years of education: Not on file  . Highest education level: Not on file  Occupational History  . Not on file  Tobacco Use  . Smoking status: Never Smoker  . Smokeless tobacco: Never Used  Substance and Sexual Activity  . Alcohol use: Never  . Drug use: Never  . Sexual activity: Yes  Other Topics Concern  . Not on file  Social History Narrative  . Not on file   Social Determinants of Health   Financial Resource Strain: Not on file  Food Insecurity: No Food Insecurity  . Worried About Programme researcher, broadcasting/film/video in the Last Year: Never true  . Ran Out of Food in the Last Year: Never true  Transportation Needs: No Transportation Needs  . Lack of Transportation (Medical): No  . Lack of Transportation (Non-Medical): No  Physical Activity: Not on file  Stress: Not on file  Social Connections: Not on file  Intimate Partner Violence: Not on file   No current facility-administered medications on file prior to encounter.   No  current outpatient medications on file prior to encounter.   Allergies  Allergen Reactions  . Peanut-Containing Drug Products Other (See Comments)    ROS:  Review of Systems  Constitutional: Negative for chills, fatigue and fever.  HENT: Positive for congestion, postnasal drip, rhinorrhea and sore throat.   Respiratory: Negative for shortness of breath.   Cardiovascular: Negative for chest pain.  Gastrointestinal: Positive for nausea and vomiting.  Genitourinary: Negative for difficulty urinating, dysuria, flank pain, pelvic pain, vaginal bleeding, vaginal discharge and vaginal pain.  Neurological: Positive for headaches. Negative for dizziness.  Psychiatric/Behavioral: Negative.      I have reviewed patient's Past Medical Hx, Surgical Hx, Family Hx, Social Hx, medications and allergies.   Physical Exam   Patient Vitals for the past 24 hrs:  BP Temp Pulse Resp SpO2 Weight  06/20/20 1756 129/74 100 F (37.8 C) 94 12 97 % --  06/20/20 1306 131/63 98.3 F (36.8 C) 94 (!) 22 97 % --  06/20/20 1302 -- -- -- -- -- 85.9 kg   Constitutional: Well-developed, well-nourished female in no acute distress. HEENT: left ear with mild erythema, no edema or exudate  Cardiovascular: normal rate Respiratory: normal effort GI: Abd soft, non-tender. Pos BS x 4 MS: Extremities nontender, no edema, normal ROM Neurologic: Alert and oriented x 4.  GU: Neg CVAT.   FHT 172 by doppler  LAB RESULTS Results for orders placed or performed during the hospital encounter of 06/20/20 (from the past 24 hour(s))  Resp Panel by RT-PCR (Flu A&B, Covid) Nasopharyngeal Swab     Status: None   Collection Time: 06/20/20  1:23 PM   Specimen: Nasopharyngeal Swab; Nasopharyngeal(NP) swabs in vial transport medium  Result Value Ref Range   SARS Coronavirus 2 by RT PCR NEGATIVE NEGATIVE   Influenza A by PCR NEGATIVE NEGATIVE   Influenza B by PCR NEGATIVE NEGATIVE  Group A Strep by PCR     Status: None    Collection Time: 06/20/20  1:24 PM   Specimen: Nasopharyngeal Swab; Sterile Swab  Result Value Ref Range   Group A Strep by PCR NOT DETECTED NOT DETECTED  Urinalysis, Routine w reflex microscopic Urine, Clean Catch     Status: Abnormal   Collection Time: 06/20/20  1:41 PM  Result Value Ref Range   Color, Urine YELLOW YELLOW   APPearance CLEAR CLEAR   Specific Gravity, Urine 1.010 1.005 - 1.030   pH 7.0 5.0 - 8.0   Glucose, UA >=500 (A) NEGATIVE mg/dL   Hgb urine dipstick NEGATIVE NEGATIVE   Bilirubin Urine NEGATIVE NEGATIVE   Ketones, ur 5 (A) NEGATIVE mg/dL   Protein, ur NEGATIVE NEGATIVE mg/dL   Nitrite NEGATIVE NEGATIVE   Leukocytes,Ua NEGATIVE NEGATIVE   RBC / HPF 0-5 0 - 5 RBC/hpf   WBC, UA 0-5 0 - 5 WBC/hpf   Bacteria, UA RARE (A) NONE SEEN   Squamous Epithelial / LPF 0-5 0 - 5   Mucus PRESENT   CBC     Status: None   Collection Time: 06/20/20  1:59 PM  Result Value Ref Range   WBC 9.4 4.0 - 10.5 K/uL   RBC 4.52 3.87 - 5.11 MIL/uL   Hemoglobin 12.9 12.0 - 15.0 g/dL   HCT 62.5 63.8 - 93.7 %   MCV 86.1 80.0 - 100.0 fL   MCH 28.5 26.0 - 34.0 pg   MCHC 33.2 30.0 - 36.0 g/dL   RDW 34.2 87.6 - 81.1 %   Platelets 283 150 - 400 K/uL   nRBC 0.0 0.0 - 0.2 %  Comprehensive metabolic panel     Status: Abnormal   Collection Time: 06/20/20  1:59 PM  Result Value Ref Range   Sodium 135 135 - 145 mmol/L   Potassium 3.4 (L) 3.5 - 5.1 mmol/L   Chloride 103 98 - 111 mmol/L   CO2 22 22 - 32 mmol/L   Glucose, Bld 132 (H) 70 - 99 mg/dL   BUN <5 (L) 6 - 20 mg/dL   Creatinine, Ser 5.72 0.44 - 1.00 mg/dL   Calcium 9.7 8.9 - 62.0 mg/dL   Total Protein 7.5 6.5 - 8.1 g/dL   Albumin 3.5 3.5 - 5.0 g/dL   AST 62 (H) 15 - 41 U/L   ALT 92 (H) 0 - 44 U/L   Alkaline Phosphatase 60 38 - 126 U/L   Total Bilirubin 0.5 0.3 - 1.2 mg/dL   GFR, Estimated >35 >59 mL/min   Anion gap 10 5 - 15    --/--/O POS (06/21 2034)  IMAGING No results found.  MAU Management/MDM: Orders Placed This  Encounter  Procedures  . Resp Panel by RT-PCR (Flu A&B, Covid) Nasopharyngeal Swab  . Group A Strep by PCR  . Urinalysis, Routine w reflex microscopic Urine, Clean Catch  . CBC  . Comprehensive metabolic  panel  . Airborne and Contact precautions  . Discharge patient    Meds ordered this encounter  Medications  . promethazine (PHENERGAN) 12.5 mg in sodium chloride 0.9 % 1,000 mL infusion  . famotidine (PEPCID) IVPB 20 mg premix  . pseudoephedrine (SUDAFED) tablet 60 mg  . promethazine (PHENERGAN) 25 MG tablet    Sig: Take 0.5-1 tablets (12.5-25 mg total) by mouth every 6 (six) hours as needed for nausea.    Dispense:  30 tablet    Refill:  0    Order Specific Question:   Supervising Provider    Answer:   Adam Phenix [[id number]]    Pt given antiemetics/IV fluids with improved symptoms. She tolerated PO food/fluids in MAU.  Sudafed given with mild improvement in symptoms. Right ear with mild erythema but no other s/sx of infection. COVID, flu, and strep negative today.  Likely unspecified viral URI with some gastroenteritis r/t postnasal drip.  Rx for phenergan for pt to take PRN to keep down fluids. Rest, PO fluids, Tylenol PRN. Return precautions given.  F/U in office as scheduled.     ASSESSMENT 1. Viral upper respiratory illness   2. Viral gastroenteritis   3. [redacted] weeks gestation of pregnancy     PLAN Discharge home Allergies as of 06/20/2020      Reactions   Peanut-containing Drug Products Other (See Comments)      Medication List    TAKE these medications   promethazine 25 MG tablet Commonly known as: PHENERGAN Take 0.5-1 tablets (12.5-25 mg total) by mouth every 6 (six) hours as needed for nausea.       Follow-up Information    Your prenatal care provider Follow up.   Why: As scheduled              Sharen Counter Certified Nurse-Midwife 06/20/2020  9:26 PM

## 2020-06-20 NOTE — MAU Note (Addendum)
Pt reports that she has had a sore throat for the last 3 days. Pt reports the throat feels swollen now. Pt reports not being able to keep anything down for two days except for one orange.   Pt reports a headache that just started.   Pt reports a runny nose that started yesterday

## 2020-07-05 ENCOUNTER — Inpatient Hospital Stay (HOSPITAL_COMMUNITY)
Admission: AD | Admit: 2020-07-05 | Discharge: 2020-07-05 | Disposition: A | Discharge status: Home / Self Care | Payer: BC Managed Care – PPO | Attending: Obstetrics and Gynecology | Admitting: Obstetrics and Gynecology

## 2020-07-05 DIAGNOSIS — O26892 Other specified pregnancy related conditions, second trimester: Secondary | ICD-10-CM | POA: Insufficient documentation

## 2020-07-05 DIAGNOSIS — G501 Atypical facial pain: Secondary | ICD-10-CM | POA: Diagnosis not present

## 2020-07-05 DIAGNOSIS — Z3A16 16 weeks gestation of pregnancy: Secondary | ICD-10-CM | POA: Insufficient documentation

## 2020-07-05 DIAGNOSIS — O26891 Other specified pregnancy related conditions, first trimester: Secondary | ICD-10-CM | POA: Diagnosis not present

## 2020-07-05 DIAGNOSIS — K0889 Other specified disorders of teeth and supporting structures: Secondary | ICD-10-CM | POA: Diagnosis not present

## 2020-07-05 DIAGNOSIS — G500 Trigeminal neuralgia: Secondary | ICD-10-CM

## 2020-07-05 MED ORDER — OXYCODONE-ACETAMINOPHEN 5-325 MG PO TABS: 1.0000 | ORAL_TABLET | ORAL | 0 refills | Status: DC | PRN

## 2020-07-05 NOTE — Discharge Instructions (Signed)
Dental Pain Dental pain is often a sign that something is wrong with your teeth or gums. You can also have pain after a dental treatment. If you have dental pain, it is important to contact your dentist, especially if the cause of the pain is not known. Dental pain may hurt a lot or a little and can be caused by many things, including:  Tooth decay (cavities or caries).  Infection.  The inner part of the tooth being filled with pus (an abscess).  Injury.  A crack in the tooth.  Gums that move back and expose the root of a tooth.  Gum disease.  Abnormal grinding or clenching of teeth.  Not taking good care of your teeth. Sometimes the cause of pain is not known. You may have pain all the time, or it may happen only when you are:  Chewing.  Exposed to hot or cold temperatures.  Eating or drinking foods or drinks that have a lot of sugar in them, such as soda or candy. Follow these instructions at home: Medicines  Take over-the-counter and prescription medicines only as told by your dentist.  If you were prescribed an antibiotic medicine, take it as told by your dentist. Do not stop taking it even if you start to feel better. Eating and drinking Do not eat foods or drinks that cause you pain. These include:  Very hot or very cold foods or drinks.  Sweet or sugary foods or drinks. Managing pain and swelling  If told, put ice on the painful area of your face. To do this: ? Put ice in a plastic bag. ? Place a towel between your skin and the bag. ? Leave the ice on for 20 minutes, 2-3 times a day. ? Take off the ice if your skin turns bright red. This is very important. If you cannot feel pain, heat, or cold, you have a greater risk of damage to the area.   Brushing your teeth  Brush your teeth twice a day using a fluoride toothpaste.  Use a toothpaste made for sensitive teeth as told by your dentist.  Use a soft toothbrush. General instructions  Floss your teeth at  least once a day.  Do not put heat on the outside of your face.  Rinse your mouth often with salt water. To make salt water, dissolve -1 tsp (3-6 g) of salt in 1 cup (237 mL) of warm water.  Watch your dental pain. Let your dentist know if there are any changes.  Keep all follow-up visits. Contact a dentist if:  You have dental pain and you do not know why.  Medicine does not help your pain.  Your symptoms get worse.  You have new symptoms. Get help right away if:  You cannot open your mouth.  You are having trouble breathing or swallowing.  You have a fever.  Your face, neck, or jaw is swollen. These symptoms may be an emergency. Get help right away. Call your local emergency services (911 in the [place]  Do not wait to see if the symptoms will go away.  Do not drive yourself to the hospital. Summary  Dental pain may be caused by many things, including tooth decay, injury, or infection. In some cases, the cause is not known.  Dental pain may hurt a lot or very little. You may have pain all the time, or you may have it only when you eat or drink.  Take over-the-counter and prescription medicines only as told   by your dentist.  Watch your dental pain for any changes. Let your dentist know if symptoms get worse. This information is not intended to replace advice given to you by your health care provider. Make sure you discuss any questions you have with your health care provider. Document Revised: 11/30/2019 Document Reviewed: 11/30/2019 Elsevier Patient Education  2021 [employer] Inc.  

## 2020-07-05 NOTE — MAU Provider Note (Addendum)
Patient Shirley Gutierrez is a 19 y.o. G2P0010  at [redacted]w[redacted]d here with complaints of toochache on the top and bottom that started last night. She states that her tooth started hurting last night; constant pain that worsens and abates. Currently 8/10. She tried tylenol and iburpofen, cold compressions, oral gel. Nothing has worked. She has never had tooth ache like this before.  She reports that it is her tooth pain is the front two teeth and the bottom two teeth. It feels like its throbbing occasionally. She denies any facial trauma, eating hard foods, grinding teeth. She denies any trouble swallowing or facial pain elsewhere.    Her Ob suggested that she come to MAU to be seen. She denies any contractions, vaginal bleeding, vaginal discharge or other concerns about her pregnancy.   Patient has a dental appointment on Tuesday, May 3rd but she hopes to get an appointment tomorrow.    Review of Systems  Constitutional: Negative.   HENT: Positive for dental problem.   Respiratory: Negative.   Genitourinary: Negative.   Neurological: Negative.    Physical Exam   Last menstrual period 03/09/2020.  Physical Exam HENT:     Right Ear: Tympanic membrane normal.     Nose: Nose normal.  Pulmonary:     Effort: Pulmonary effort is normal.  Musculoskeletal:        General: Normal range of motion.  Skin:    General: Skin is warm.  Neurological:     General: No focal deficit present.     Mental Status: She is alert.   Patient appears to have normal dentition. Examined by Dr. Crissie Reese, front upper and lower teeth are tender, no abscesses, pus or swelling noted.   MAU Course  Procedures  MDM -Dr. Crissie Reese examined patient, no sign of trauma, swelling or abscess. Unsure etiology but unlikely to be infection. Pain medicine given and follow up with dentist.   Assessment and Plan   1. Orofacial neuropathic pain   2. Toothache   -Patient sent home with RX for percocet and will call dentist  tomorrow for work-in -Work note given as patient has exam and cannot drive or do school work while taking pain medicine.  -Follow up at Urgent Care/[facility] if condition worsens or changes  [name] 07/05/2020, 8:45 PM

## 2020-07-17 DIAGNOSIS — O99212 Obesity complicating pregnancy, second trimester: Secondary | ICD-10-CM | POA: Diagnosis not present

## 2020-07-17 DIAGNOSIS — Z3A18 18 weeks gestation of pregnancy: Secondary | ICD-10-CM | POA: Diagnosis not present

## 2020-07-17 DIAGNOSIS — Z363 Encounter for antenatal screening for malformations: Secondary | ICD-10-CM | POA: Diagnosis not present

## 2020-07-31 DIAGNOSIS — Z363 Encounter for antenatal screening for malformations: Secondary | ICD-10-CM | POA: Diagnosis not present

## 2020-07-31 DIAGNOSIS — Z3A20 20 weeks gestation of pregnancy: Secondary | ICD-10-CM | POA: Diagnosis not present

## 2020-08-13 DIAGNOSIS — Q210 Ventricular septal defect: Secondary | ICD-10-CM | POA: Diagnosis not present

## 2020-08-13 DIAGNOSIS — O358XX0 Maternal care for other (suspected) fetal abnormality and damage, not applicable or unspecified: Secondary | ICD-10-CM | POA: Diagnosis not present

## 2020-08-14 DIAGNOSIS — Q210 Ventricular septal defect: Secondary | ICD-10-CM | POA: Diagnosis not present

## 2020-08-14 DIAGNOSIS — O358XX0 Maternal care for other (suspected) fetal abnormality and damage, not applicable or unspecified: Secondary | ICD-10-CM | POA: Diagnosis not present

## 2020-09-07 DIAGNOSIS — M9901 Segmental and somatic dysfunction of cervical region: Secondary | ICD-10-CM | POA: Diagnosis not present

## 2020-09-07 DIAGNOSIS — M9903 Segmental and somatic dysfunction of lumbar region: Secondary | ICD-10-CM | POA: Diagnosis not present

## 2020-09-07 DIAGNOSIS — M9905 Segmental and somatic dysfunction of pelvic region: Secondary | ICD-10-CM | POA: Diagnosis not present

## 2020-09-07 DIAGNOSIS — M9904 Segmental and somatic dysfunction of sacral region: Secondary | ICD-10-CM | POA: Diagnosis not present

## 2020-09-12 DIAGNOSIS — M9905 Segmental and somatic dysfunction of pelvic region: Secondary | ICD-10-CM | POA: Diagnosis not present

## 2020-09-12 DIAGNOSIS — M9904 Segmental and somatic dysfunction of sacral region: Secondary | ICD-10-CM | POA: Diagnosis not present

## 2020-09-12 DIAGNOSIS — M9901 Segmental and somatic dysfunction of cervical region: Secondary | ICD-10-CM | POA: Diagnosis not present

## 2020-09-12 DIAGNOSIS — M9903 Segmental and somatic dysfunction of lumbar region: Secondary | ICD-10-CM | POA: Diagnosis not present

## 2020-09-14 DIAGNOSIS — M9904 Segmental and somatic dysfunction of sacral region: Secondary | ICD-10-CM | POA: Diagnosis not present

## 2020-09-14 DIAGNOSIS — M9905 Segmental and somatic dysfunction of pelvic region: Secondary | ICD-10-CM | POA: Diagnosis not present

## 2020-09-14 DIAGNOSIS — M9901 Segmental and somatic dysfunction of cervical region: Secondary | ICD-10-CM | POA: Diagnosis not present

## 2020-09-14 DIAGNOSIS — M9903 Segmental and somatic dysfunction of lumbar region: Secondary | ICD-10-CM | POA: Diagnosis not present

## 2020-09-25 DIAGNOSIS — I871 Compression of vein: Secondary | ICD-10-CM | POA: Diagnosis not present

## 2020-09-25 DIAGNOSIS — Q210 Ventricular septal defect: Secondary | ICD-10-CM | POA: Diagnosis not present

## 2020-09-26 DIAGNOSIS — M9905 Segmental and somatic dysfunction of pelvic region: Secondary | ICD-10-CM | POA: Diagnosis not present

## 2020-09-26 DIAGNOSIS — Z3689 Encounter for other specified antenatal screening: Secondary | ICD-10-CM | POA: Diagnosis not present

## 2020-09-26 DIAGNOSIS — M9903 Segmental and somatic dysfunction of lumbar region: Secondary | ICD-10-CM | POA: Diagnosis not present

## 2020-09-26 DIAGNOSIS — M9901 Segmental and somatic dysfunction of cervical region: Secondary | ICD-10-CM | POA: Diagnosis not present

## 2020-09-26 DIAGNOSIS — M9904 Segmental and somatic dysfunction of sacral region: Secondary | ICD-10-CM | POA: Diagnosis not present

## 2020-09-27 DIAGNOSIS — M9905 Segmental and somatic dysfunction of pelvic region: Secondary | ICD-10-CM | POA: Diagnosis not present

## 2020-09-27 DIAGNOSIS — M9904 Segmental and somatic dysfunction of sacral region: Secondary | ICD-10-CM | POA: Diagnosis not present

## 2020-09-27 DIAGNOSIS — M9901 Segmental and somatic dysfunction of cervical region: Secondary | ICD-10-CM | POA: Diagnosis not present

## 2020-09-27 DIAGNOSIS — M9903 Segmental and somatic dysfunction of lumbar region: Secondary | ICD-10-CM | POA: Diagnosis not present

## 2020-10-04 DIAGNOSIS — M9904 Segmental and somatic dysfunction of sacral region: Secondary | ICD-10-CM | POA: Diagnosis not present

## 2020-10-04 DIAGNOSIS — M9903 Segmental and somatic dysfunction of lumbar region: Secondary | ICD-10-CM | POA: Diagnosis not present

## 2020-10-04 DIAGNOSIS — M9901 Segmental and somatic dysfunction of cervical region: Secondary | ICD-10-CM | POA: Diagnosis not present

## 2020-10-04 DIAGNOSIS — M9905 Segmental and somatic dysfunction of pelvic region: Secondary | ICD-10-CM | POA: Diagnosis not present

## 2020-10-05 DIAGNOSIS — M9903 Segmental and somatic dysfunction of lumbar region: Secondary | ICD-10-CM | POA: Diagnosis not present

## 2020-10-05 DIAGNOSIS — M9905 Segmental and somatic dysfunction of pelvic region: Secondary | ICD-10-CM | POA: Diagnosis not present

## 2020-10-05 DIAGNOSIS — M9904 Segmental and somatic dysfunction of sacral region: Secondary | ICD-10-CM | POA: Diagnosis not present

## 2020-10-05 DIAGNOSIS — M9901 Segmental and somatic dysfunction of cervical region: Secondary | ICD-10-CM | POA: Diagnosis not present

## 2020-10-05 DIAGNOSIS — O99810 Abnormal glucose complicating pregnancy: Secondary | ICD-10-CM | POA: Diagnosis not present

## 2020-10-11 ENCOUNTER — Inpatient Hospital Stay (HOSPITAL_COMMUNITY)
Admission: AD | Admit: 2020-10-11 | Discharge: 2020-10-12 | Disposition: A | Discharge status: Home / Self Care | Payer: BC Managed Care – PPO | Attending: Obstetrics and Gynecology | Admitting: Obstetrics and Gynecology

## 2020-10-11 DIAGNOSIS — Z3A31 31 weeks gestation of pregnancy: Secondary | ICD-10-CM | POA: Insufficient documentation

## 2020-10-11 DIAGNOSIS — L299 Pruritus, unspecified: Secondary | ICD-10-CM | POA: Insufficient documentation

## 2020-10-11 DIAGNOSIS — O9A213 Injury, poisoning and certain other consequences of external causes complicating pregnancy, third trimester: Secondary | ICD-10-CM | POA: Insufficient documentation

## 2020-10-11 DIAGNOSIS — O26893 Other specified pregnancy related conditions, third trimester: Secondary | ICD-10-CM | POA: Insufficient documentation

## 2020-10-11 DIAGNOSIS — T7840XA Allergy, unspecified, initial encounter: Secondary | ICD-10-CM | POA: Insufficient documentation

## 2020-10-11 MED ORDER — LORATADINE 10 MG PO TABS
10.0000 mg | ORAL_TABLET | Freq: Once | ORAL | Status: AC
  Administered 2020-10-11: 10 mg via ORAL
  Filled 2020-10-11: qty 1

## 2020-10-11 NOTE — MAU Note (Signed)
I am having a lot of itching all over. Started an hour and half ago. "I got goose bumps and was feeling cold" about ago. Has allergy to peanuts but happened yrs ago and does not know how she reacts. Has not taken any meds for itching. Does not have rash but some tiny bumps on arms that itch. No pregnancy concerns. No pain and baby moving well

## 2020-10-12 ENCOUNTER — Encounter (HOSPITAL_COMMUNITY): Payer: Self-pay | Admitting: Obstetrics and Gynecology

## 2020-10-12 DIAGNOSIS — Z3A31 31 weeks gestation of pregnancy: Secondary | ICD-10-CM

## 2020-10-12 DIAGNOSIS — L299 Pruritus, unspecified: Secondary | ICD-10-CM

## 2020-10-12 DIAGNOSIS — O99713 Diseases of the skin and subcutaneous tissue complicating pregnancy, third trimester: Secondary | ICD-10-CM

## 2020-10-12 LAB — COMPREHENSIVE METABOLIC PANEL
ALT: 28 U/L (ref 0–44)
AST: 38 U/L (ref 15–41)
Albumin: 2.9 g/dL — ABNORMAL LOW (ref 3.5–5.0)
Alkaline Phosphatase: 115 U/L (ref 38–126)
Anion gap: 9 (ref 5–15)
BUN: <5 mg/dL — ABNORMAL LOW (ref 6–20)
CO2: 22 mmol/L (ref 22–32)
Calcium: 9.2 mg/dL (ref 8.9–10.3)
Chloride: 104 mmol/L (ref 98–111)
Creatinine, Ser: 0.52 mg/dL (ref 0.44–1.00)
GFR, Estimated: >60 mL/min (ref >60)
Glucose, Bld: 101 mg/dL — ABNORMAL HIGH (ref 70–99)
Potassium: 3.5 mmol/L (ref 3.5–5.1)
Sodium: 135 mmol/L (ref 135–145)
Total Bilirubin: 0.5 mg/dL (ref 0.3–1.2)
Total Protein: 6.6 g/dL (ref 6.5–8.1)

## 2020-10-12 MED ORDER — CETIRIZINE HCL 10 MG PO TABS: 10.0000 mg | ORAL_TABLET | Freq: Every day | ORAL | 1 refills | Status: DC

## 2020-10-12 NOTE — MAU Note (Signed)
Marie Williams CNM in Triage to see pt and discuss d/c plan. Written and verbal d/c instructions given and understanding voiced.  

## 2020-10-12 NOTE — MAU Provider Note (Addendum)
Chief Complaint:  Allergic Reaction   None    HPI: Shirley Gutierrez is a 20 y.o. G2P0010 at 57w0dwho presents to maternity admissions reporting itching all over body for past hour or so.  Unclear what the source is.  No known contact with allergens.  No rash.  . She reports good fetal movement, denies LOF, vaginal bleeding, vaginal itching/burning, urinary symptoms, h/a, dizziness, n/v, diarrhea, constipation or fever/chills.    Allergic Reaction This is a new problem. The current episode started today. The problem occurs constantly. The problem is unchanged. It is unknown what she was exposed to. Associated symptoms include itching. Pertinent negatives include no abdominal pain, chest pain, chest pressure, coughing, diarrhea, difficulty breathing, eye redness, rash, trouble swallowing, vomiting or wheezing. There is no swelling present. Past treatments include nothing. Her past medical history is significant for food allergies and medication allergies. There is no history of asthma.   RN Note: I am having a lot of itching all over. Started an hour and half ago. "I got goose bumps and was feeling cold" about ago. Has allergy to peanuts but happened yrs ago and does not know how she reacts. Has not taken any meds for itching. Does not have rash but some tiny bumps on arms that itch. No pregnancy concerns. No pain and baby moving well  Past Medical History: Past Medical History:  Diagnosis Date   Headache    Heart murmur    when she was a child    Past obstetric history: OB History  Gravida Para Term Preterm AB Living  2 0 0 0 1 0  SAB IAB Ectopic Multiple Live Births  1 0 0 0 0    # Outcome Date GA Lbr Len/2nd Weight Sex Delivery Anes PTL Lv  2 Current           1 SAB             Past Surgical History: Past Surgical History:  Procedure Laterality Date   HEART CHAMBER REVISION     closed hole in heart when seh was 2 months old   OVARIAN CYST REMOVAL      Family  History: Family History  Problem Relation Age of Onset   Diabetes Father     Social History: Social History   Tobacco Use   Smoking status: Never   Smokeless tobacco: Never  Substance Use Topics   Alcohol use: Never   Drug use: Never    Allergies:  Allergies  Allergen Reactions   Peanut-Containing Drug Products Other (See Comments)   Promethazine Itching, Nausea Only and Other (See Comments)    Drowsy. Drowsy. Drowsy. Drowsy. Drowsy.     Meds:  Medications Prior to Admission  Medication Sig Dispense Refill Last Dose   Prenatal Vit-Fe Fumarate-FA (PRENATAL MULTIVITAMIN) TABS tablet Take 1 tablet by mouth daily at 12 noon.      oxyCODONE-acetaminophen (PERCOCET) 5-325 MG tablet Take 1-2 tablets by mouth every 4 (four) hours as needed for severe pain. 30 tablet 0     I have reviewed patient's Past Medical Hx, Surgical Hx, Family Hx, Social Hx, medications and allergies.   ROS:  Review of Systems  HENT:  Negative for trouble swallowing.   Eyes:  Negative for redness.  Respiratory:  Negative for cough and wheezing.   Cardiovascular:  Negative for chest pain.  Gastrointestinal:  Negative for abdominal pain, diarrhea and vomiting.  Skin:  Positive for itching. Negative for rash.  Allergic/Immunologic: Positive for  food allergies.  Other systems negative  Physical Exam  Patient Vitals for the past 24 hrs:  BP Temp Pulse Resp SpO2  10/11/20 2157 120/63 -- -- -- --  10/11/20 2155 -- 98.2 F (36.8 C) 91 18 98 %   Constitutional: Well-developed, well-nourished female in no acute distress.  Cardiovascular: normal rate and rhythm Respiratory: normal effort, clear to auscultation bilaterally GI: Abd soft, non-tender, gravid appropriate for gestational age.   No rebound or guarding. MS: Extremities nontender, no edema, normal ROM Neurologic: Alert and oriented x 4.  GU: Neg CVAT. Skin:  NO rash visible  FHT:   147  Labs: Results for orders placed or performed  during the hospital encounter of 10/11/20 (from the past 24 hour(s))  Comprehensive metabolic panel     Status: Abnormal   Collection Time: 10/11/20 11:00 PM  Result Value Ref Range   Sodium 135 135 - 145 mmol/L   Potassium 3.5 3.5 - 5.1 mmol/L   Chloride 104 98 - 111 mmol/L   CO2 22 22 - 32 mmol/L   Glucose, Bld 101 (H) 70 - 99 mg/dL   BUN <5 (L) 6 - 20 mg/dL   Creatinine, Ser 3.15 0.44 - 1.00 mg/dL   Calcium 9.2 8.9 - 17.6 mg/dL   Total Protein 6.6 6.5 - 8.1 g/dL   Albumin 2.9 (L) 3.5 - 5.0 g/dL   AST 38 15 - 41 U/L   ALT 28 0 - 44 U/L   Alkaline Phosphatase 115 38 - 126 U/L   Total Bilirubin 0.5 0.3 - 1.2 mg/dL   GFR, Estimated >16 >07 mL/min   Anion gap 9 5 - 15   Bile Acids pending  Imaging:  No results found.  MAU Course/MDM: I have ordered labs and reviewed results. LFTs normal  Bile acids pending to rule out cholestasis   Treatments in MAU included Claritin which significantly improved her itching Discussed this could be allergic response to something, or cholestasis.  PUPPS less likely since itching is all over and no rash. .    Assessment: sIngle IUP at [redacted]w[redacted]d Itching. Likely allergic response  Plan: Discharge home Rx zyrtec prn itching If bile acids elevated will treat for Cholestatsis Follow up in Office for prenatal visits and recheck of status Encouraged to return if she develops worsening of symptoms, increase in pain, fever, or other concerning symptoms.  Pt stable at time of discharge.  Wynelle Bourgeois CNM, MSN Certified Nurse-Midwife 10/12/2020 12:59 AM

## 2020-10-13 LAB — BILE ACIDS, TOTAL: Bile Acids Total: 1.7 umol/L (ref 0.0–10.0)

## 2020-11-12 ENCOUNTER — Encounter (HOSPITAL_COMMUNITY): Payer: Self-pay | Admitting: Obstetrics and Gynecology

## 2020-11-12 ENCOUNTER — Other Ambulatory Visit: Payer: Self-pay

## 2020-11-12 ENCOUNTER — Inpatient Hospital Stay (HOSPITAL_COMMUNITY)
Admission: AD | Admit: 2020-11-12 | Discharge: 2020-11-13 | Disposition: A | Discharge status: Home / Self Care | Payer: BC Managed Care – PPO | Attending: Obstetrics and Gynecology | Admitting: Obstetrics and Gynecology

## 2020-11-12 DIAGNOSIS — Z3A35 35 weeks gestation of pregnancy: Secondary | ICD-10-CM | POA: Insufficient documentation

## 2020-11-12 DIAGNOSIS — R109 Unspecified abdominal pain: Secondary | ICD-10-CM | POA: Insufficient documentation

## 2020-11-12 DIAGNOSIS — Z79899 Other long term (current) drug therapy: Secondary | ICD-10-CM | POA: Diagnosis not present

## 2020-11-12 DIAGNOSIS — O4703 False labor before 37 completed weeks of gestation, third trimester: Secondary | ICD-10-CM | POA: Diagnosis not present

## 2020-11-12 DIAGNOSIS — O26893 Other specified pregnancy related conditions, third trimester: Secondary | ICD-10-CM | POA: Insufficient documentation

## 2020-11-12 DIAGNOSIS — Z0371 Encounter for suspected problem with amniotic cavity and membrane ruled out: Secondary | ICD-10-CM | POA: Insufficient documentation

## 2020-11-12 LAB — URINALYSIS, ROUTINE W REFLEX MICROSCOPIC
Bilirubin Urine: NEGATIVE
Glucose, UA: NEGATIVE mg/dL
Hgb urine dipstick: NEGATIVE
Ketones, ur: NEGATIVE mg/dL
Nitrite: NEGATIVE
Protein, ur: NEGATIVE mg/dL
Specific Gravity, Urine: 1.019 (ref 1.005–1.030)
pH: 6 (ref 5.0–8.0)

## 2020-11-12 MED ORDER — NIFEDIPINE 10 MG PO CAPS
10.0000 mg | ORAL_CAPSULE | ORAL | Status: AC | PRN
  Administered 2020-11-13 (×3): 10 mg via ORAL
  Filled 2020-11-12 (×4): qty 1

## 2020-11-12 NOTE — MAU Provider Note (Signed)
Chief Complaint:  Rupture of Membranes and Contractions   Event Date/Time   First Provider Initiated Contact with Patient 11/12/20 2308      HPI: Shirley Gutierrez is a 20 y.o. G2P0010 at [redacted]w[redacted]d who presents to maternity admissions reporting gush of fluid at 9 pm and painful contractions every 5-10 minutes.  She reports she was leaking water and could not control it x 1 episode tonight. She has not had any leaking since and has not required a pad.  She reports good fetal movement.   Location: lower abdomen, pelvis, lower back Quality: cramping Severity: 8/10 on pain scale Duration: 4-5 hours Timing: intermittent Modifying factors: none Associated signs and symptoms: episode of leaking fluid  HPI  Past Medical History: Past Medical History:  Diagnosis Date   Headache    Heart murmur    when she was a child    Past obstetric history: OB History  Gravida Para Term Preterm AB Living  2 0 0 0 1 0  SAB IAB Ectopic Multiple Live Births  1 0 0 0 0    # Outcome Date GA Lbr Len/2nd Weight Sex Delivery Anes PTL Lv  2 Current           1 SAB 08/29/19 [redacted]w[redacted]d           Past Surgical History: Past Surgical History:  Procedure Laterality Date   HEART CHAMBER REVISION     closed hole in heart when seh was 2 months old   OVARIAN CYST REMOVAL      Family History: Family History  Problem Relation Age of Onset   Diabetes Father     Social History: Social History   Tobacco Use   Smoking status: Never   Smokeless tobacco: Never  Substance Use Topics   Alcohol use: Never   Drug use: Never    Allergies:  Allergies  Allergen Reactions   Peanut-Containing Drug Products Other (See Comments)   Promethazine Itching, Nausea Only and Other (See Comments)    Drowsy. Drowsy. Drowsy. Drowsy. Drowsy.     Meds:  Medications Prior to Admission  Medication Sig Dispense Refill Last Dose   Prenatal Vit-Fe Fumarate-FA (PRENATAL MULTIVITAMIN) TABS tablet Take 1 tablet by mouth  daily at 12 noon.   Past Week   cetirizine (ZYRTEC ALLERGY) 10 MG tablet Take 1 tablet (10 mg total) by mouth daily. 30 tablet 1     ROS:  Review of Systems   I have reviewed patient's Past Medical Hx, Surgical Hx, Family Hx, Social Hx, medications and allergies.   Physical Exam  Patient Vitals for the past 24 hrs:  BP Temp Temp src Pulse Resp SpO2  11/13/20 0100 (!) 106/57 -- -- 70 -- --  11/13/20 0041 104/60 -- -- 70 -- --  11/13/20 0028 128/62 -- -- -- -- --  11/13/20 0021 128/62 -- -- 67 -- --  11/13/20 0005 (!) 125/55 -- -- 69 -- --  11/12/20 2258 129/70 -- -- 75 -- --  11/12/20 2246 134/66 98.1 F (36.7 C) Oral 68 20 100 %   Constitutional: Well-developed, well-nourished female in no acute distress.  Cardiovascular: normal rate Respiratory: normal effort GI: Abd soft, non-tender, gravid appropriate for gestational age.  MS: Extremities nontender, no edema, normal ROM Neurologic: Alert and oriented x 4.  GU: Neg CVAT.  PELVIC EXAM: Cervix pink, visually closed, without lesion, scant white creamy discharge, vaginal walls and external genitalia normal Bimanual exam: Cervix 0/long/high, firm, anterior, neg CMT, uterus nontender,  nonenlarged, adnexa without tenderness, enlargement, or mass  Dilation: Closed Effacement (%): 70 Cervical Position: Posterior Station: -2 Exam by:: Sharen Counter, CNM  FHT:  Baseline 145 , moderate variability, accelerations present, no decelerations Contractions: q 4-10 mins   Labs: Results for orders placed or performed during the hospital encounter of 11/12/20 (from the past 24 hour(s))  Urinalysis, Routine w reflex microscopic Urine, Clean Catch     Status: Abnormal   Collection Time: 11/12/20 10:50 PM  Result Value Ref Range   Color, Urine YELLOW YELLOW   APPearance HAZY (A) CLEAR   Specific Gravity, Urine 1.019 1.005 - 1.030   pH 6.0 5.0 - 8.0   Glucose, UA NEGATIVE NEGATIVE mg/dL   Hgb urine dipstick NEGATIVE NEGATIVE    Bilirubin Urine NEGATIVE NEGATIVE   Ketones, ur NEGATIVE NEGATIVE mg/dL   Protein, ur NEGATIVE NEGATIVE mg/dL   Nitrite NEGATIVE NEGATIVE   Leukocytes,Ua MODERATE (A) NEGATIVE   RBC / HPF 0-5 0 - 5 RBC/hpf   WBC, UA 6-10 0 - 5 WBC/hpf   Bacteria, UA FEW (A) NONE SEEN   Squamous Epithelial / LPF 0-5 0 - 5   Mucus PRESENT   Amnisure rupture of membrane (rom)not at [facility]     Status: None   Collection Time: 11/12/20 11:51 PM  Result Value Ref Range   Amnisure ROM NEGATIVE       Imaging:  No results found.  MAU Course/MDM: Orders Placed This Encounter  Procedures   Urinalysis, Routine w reflex microscopic Urine, Clean Catch   Amnisure rupture of membrane (rom)not at [facility]   Discharge patient    Meds ordered this encounter  Medications   NIFEdipine (PROCARDIA) capsule 10 mg     NST reviewed and reactive Cervix closed but effaced, no pooling of fluid on exam and ferning negative Amnisure negative so no evidence of ROM Procardia 10 mg x 3 doses Q 20 minutes given, pt reports contractions are less frequent and less intense No cervical change in 2 hours in MAU D/C home with PTL precautions Keep scheduled appts in the office   Assessment: 1. Threatened preterm labor, third trimester   2. [redacted] weeks gestation of pregnancy   3. Encounter for suspected premature rupture of amniotic membranes, with rupture of membranes not found     Plan: Discharge home Labor precautions and fetal kick counts  Follow-up Information     [facility] Follow up.   Why: As scheduled Contact information: [address]  [address] [place] [address] [phone]         [facility] Follow up.   Specialty: Obstetrics and Gynecology Why: For signs of labor or emergencies Contact information: [address] [id number] [place] [place] [address] [phone]               Allergies as of 11/13/2020       Reactions    Peanut-containing Drug Products Other (See Comments)   Promethazine Itching, Nausea Only, Other (See Comments)   Drowsy. Drowsy. Drowsy. Drowsy. Drowsy.        Medication List     TAKE these medications    cetirizine 10 MG tablet Commonly known as: ZyrTEC Allergy Take 1 tablet (10 mg total) by mouth daily.   prenatal multivitamin Tabs tablet Take 1 tablet by mouth daily at 12 noon.        Sharen Counter Certified Nurse-Midwife 11/13/2020 1:43 AM

## 2020-11-12 NOTE — MAU Note (Signed)
..  Shirley Gutierrez is a 20 y.o. at [redacted]w[redacted]d here in MAU reporting: Leaking of fluid since 9:30pm and contractions that are 7-10 minutes apart. Deceased movement since the leaking began.   Pain score: 7/10 Vitals:   11/12/20 2246  BP: 134/66  Pulse: 68  Resp: 20  Temp: 98.1 F (36.7 C)  SpO2: 100%     FHT: 154 Lab orders placed from triage: UA

## 2020-11-13 ENCOUNTER — Other Ambulatory Visit: Payer: Self-pay

## 2020-11-13 DIAGNOSIS — Z0371 Encounter for suspected problem with amniotic cavity and membrane ruled out: Secondary | ICD-10-CM | POA: Diagnosis not present

## 2020-11-13 DIAGNOSIS — Z3A35 35 weeks gestation of pregnancy: Secondary | ICD-10-CM | POA: Diagnosis not present

## 2020-11-13 DIAGNOSIS — O4703 False labor before 37 completed weeks of gestation, third trimester: Secondary | ICD-10-CM

## 2020-11-13 LAB — AMNISURE RUPTURE OF MEMBRANE (ROM) NOT AT ARMC: Amnisure ROM: NEGATIVE

## 2020-11-13 NOTE — Discharge Instructions (Signed)
Reasons to return to MAU at [facility]:  Since you are preterm, return to MAU if:  1.  Contractions are 10 minutes apart or less and they becoming more uncomfortable or painful over time 2.  You have a large gush of fluid, or a trickle of fluid that will not stop and you have to wear a pad 3.  You have bleeding that is bright red, heavier than spotting--like menstrual bleeding (spotting can be normal in early labor or after a check of your cervix) 4.  You do not feel the baby moving like he/she normally does  

## 2020-11-16 DIAGNOSIS — Z3A35 35 weeks gestation of pregnancy: Secondary | ICD-10-CM | POA: Diagnosis not present

## 2020-11-16 DIAGNOSIS — O24419 Gestational diabetes mellitus in pregnancy, unspecified control: Secondary | ICD-10-CM | POA: Diagnosis not present

## 2020-11-23 DIAGNOSIS — Z3685 Encounter for antenatal screening for Streptococcus B: Secondary | ICD-10-CM | POA: Diagnosis not present

## 2020-11-23 DIAGNOSIS — Z113 Encounter for screening for infections with a predominantly sexual mode of transmission: Secondary | ICD-10-CM | POA: Diagnosis not present

## 2020-11-24 ENCOUNTER — Inpatient Hospital Stay (HOSPITAL_COMMUNITY)
Admission: AD | Admit: 2020-11-24 | Discharge: 2020-11-24 | Disposition: A | Discharge status: Home / Self Care | Payer: BC Managed Care – PPO | Attending: Obstetrics and Gynecology | Admitting: Obstetrics and Gynecology

## 2020-11-24 ENCOUNTER — Encounter (HOSPITAL_COMMUNITY): Payer: Self-pay | Admitting: Obstetrics and Gynecology

## 2020-11-24 ENCOUNTER — Other Ambulatory Visit: Payer: Self-pay

## 2020-11-24 DIAGNOSIS — O212 Late vomiting of pregnancy: Secondary | ICD-10-CM | POA: Insufficient documentation

## 2020-11-24 DIAGNOSIS — O26893 Other specified pregnancy related conditions, third trimester: Secondary | ICD-10-CM

## 2020-11-24 DIAGNOSIS — Z20822 Contact with and (suspected) exposure to covid-19: Secondary | ICD-10-CM | POA: Diagnosis not present

## 2020-11-24 DIAGNOSIS — Z3A37 37 weeks gestation of pregnancy: Secondary | ICD-10-CM

## 2020-11-24 DIAGNOSIS — R6883 Chills (without fever): Secondary | ICD-10-CM | POA: Diagnosis not present

## 2020-11-24 DIAGNOSIS — R519 Headache, unspecified: Secondary | ICD-10-CM

## 2020-11-24 LAB — URINALYSIS, ROUTINE W REFLEX MICROSCOPIC
Glucose, UA: NEGATIVE mg/dL
Hgb urine dipstick: NEGATIVE
Ketones, ur: NEGATIVE mg/dL
Nitrite: NEGATIVE
Protein, ur: NEGATIVE mg/dL
Specific Gravity, Urine: 1.015 (ref 1.005–1.030)
pH: 6 (ref 5.0–8.0)

## 2020-11-24 LAB — URINALYSIS, MICROSCOPIC (REFLEX): Bacteria, UA: NONE SEEN

## 2020-11-24 LAB — RESP PANEL BY RT-PCR (FLU A&B, COVID) ARPGX2
Influenza A by PCR: NEGATIVE
Influenza B by PCR: NEGATIVE
SARS Coronavirus 2 by RT PCR: NEGATIVE

## 2020-11-24 MED ORDER — METOCLOPRAMIDE HCL 10 MG PO TABS
10.0000 mg | ORAL_TABLET | Freq: Once | ORAL | Status: AC
  Administered 2020-11-24: 10 mg via ORAL
  Filled 2020-11-24: qty 1

## 2020-11-24 MED ORDER — ONDANSETRON 4 MG PO TBDP
8.0000 mg | ORAL_TABLET | Freq: Once | ORAL | Status: AC
  Administered 2020-11-24: 8 mg via ORAL
  Filled 2020-11-24: qty 2

## 2020-11-24 MED ORDER — ONDANSETRON HCL 8 MG PO TABS: 8.0000 mg | ORAL_TABLET | Freq: Two times a day (BID) | ORAL | 0 refills | Status: DC

## 2020-11-24 MED ORDER — ACETAMINOPHEN 500 MG PO TABS
1000.0000 mg | ORAL_TABLET | Freq: Once | ORAL | Status: AC
  Administered 2020-11-24: 1000 mg via ORAL
  Filled 2020-11-24: qty 2

## 2020-11-24 NOTE — Discharge Instructions (Signed)

## 2020-11-24 NOTE — MAU Note (Addendum)
Awoke about 0500 with severe, dull headache. Having some n/v/d with chills. No visual changes. No epigastric pain. Denies lOF or VB

## 2020-11-24 NOTE — MAU Provider Note (Signed)
History     CSN: [id number]  Arrival date and time: 11/24/20 0645   Event Date/Time   First Provider Initiated Contact with Patient 11/24/20 0805      Chief Complaint  Patient presents with   Headache   HPI Shirley Gutierrez is a 20 y.o. G2P0010 at [redacted]w[redacted]d who presents with a headahce, nausea, vomiting and chills. She reports all the symptoms started at 0500. She has not tried any medication for the symptoms. She reports she was around her niece who is sick with similar symptoms. She denies any abdominal pain, vaginal bleeding or discharge. Reports normal fetal movement.   OB History     Gravida  2   Para  0   Term  0   Preterm  0   AB  1   Living  0      SAB  1   IAB  0   Ectopic  0   Multiple  0   Live Births  0           Past Medical History:  Diagnosis Date   Headache    Heart murmur    when she was a child    Past Surgical History:  Procedure Laterality Date   HEART CHAMBER REVISION     closed hole in heart when seh was 2 months old   OVARIAN CYST REMOVAL      Family History  Problem Relation Age of Onset   Diabetes Father     Social History   Tobacco Use   Smoking status: Never   Smokeless tobacco: Never  Vaping Use   Vaping Use: Never used  Substance Use Topics   Alcohol use: Never   Drug use: Never    Allergies:  Allergies  Allergen Reactions   Peanut-Containing Drug Products Other (See Comments)   Promethazine Itching, Nausea Only and Other (See Comments)    Drowsy. Drowsy. Drowsy. Drowsy. Drowsy.     Medications Prior to Admission  Medication Sig Dispense Refill Last Dose   Prenatal Vit-Fe Fumarate-FA (PRENATAL MULTIVITAMIN) TABS tablet Take 1 tablet by mouth daily at 12 noon.   11/23/2020   cetirizine (ZYRTEC ALLERGY) 10 MG tablet Take 1 tablet (10 mg total) by mouth daily. 30 tablet 1 More than a month    Review of Systems  Constitutional:  Positive for chills. Negative for fatigue and fever.  HENT:  Negative.    Respiratory: Negative.  Negative for shortness of breath.   Cardiovascular: Negative.  Negative for chest pain.  Gastrointestinal:  Positive for nausea and vomiting. Negative for abdominal pain, constipation and diarrhea.  Genitourinary: Negative.  Negative for dysuria and vaginal discharge.  Neurological:  Positive for headaches. Negative for dizziness.  Physical Exam   Blood pressure 127/72, pulse (!) 56, temperature 98.8 F (37.1 C), temperature source Oral, resp. rate 18, height 5\' 1"  (1.549 m), weight 93 kg, last menstrual period 03/09/2020, SpO2 99 %.  Physical Exam Vitals and nursing note reviewed.  Constitutional:      General: She is not in acute distress.    Appearance: She is well-developed.  HENT:     Head: Normocephalic.  Eyes:     Pupils: Pupils are equal, round, and reactive to light.  Cardiovascular:     Rate and Rhythm: Normal rate and regular rhythm.     Heart sounds: Normal heart sounds.  Pulmonary:     Effort: Pulmonary effort is normal. No respiratory distress.     Breath sounds:  Normal breath sounds.  Abdominal:     General: Bowel sounds are normal. There is no distension.     Palpations: Abdomen is soft.     Tenderness: There is no abdominal tenderness.  Skin:    General: Skin is warm and dry.  Neurological:     Mental Status: She is alert and oriented to person, place, and time.  Psychiatric:        Mood and Affect: Mood normal.        Behavior: Behavior normal.        Thought Content: Thought content normal.        Judgment: Judgment normal.   Fetal Tracing:  Baseline: 140 Variability: moderate Accels: 15x15 Decels: none  Toco: none  MAU Course  Procedures Results for orders placed or performed during the hospital encounter of 11/24/20 (from the past 24 hour(s))  Urinalysis, Routine w reflex microscopic Urine, Clean Catch     Status: Abnormal   Collection Time: 11/24/20  7:40 AM  Result Value Ref Range   Color, Urine YELLOW  YELLOW   APPearance CLEAR CLEAR   Specific Gravity, Urine 1.015 1.005 - 1.030   pH 6.0 5.0 - 8.0   Glucose, UA NEGATIVE NEGATIVE mg/dL   Hgb urine dipstick NEGATIVE NEGATIVE   Bilirubin Urine SMALL (A) NEGATIVE   Ketones, ur NEGATIVE NEGATIVE mg/dL   Protein, ur NEGATIVE NEGATIVE mg/dL   Nitrite NEGATIVE NEGATIVE   Leukocytes,Ua SMALL (A) NEGATIVE  Urinalysis, Microscopic (reflex)     Status: None   Collection Time: 11/24/20  7:40 AM  Result Value Ref Range   RBC / HPF 0-5 0 - 5 RBC/hpf   WBC, UA 6-10 0 - 5 WBC/hpf   Bacteria, UA NONE SEEN NONE SEEN   Squamous Epithelial / LPF 0-5 0 - 5   Mucus PRESENT   Resp Panel by RT-PCR (Flu A&B, Covid) Urine, Clean Catch     Status: None   Collection Time: 11/24/20  7:48 AM   Specimen: Urine, Clean Catch; Nasopharyngeal(NP) swabs in vial transport medium  Result Value Ref Range   SARS Coronavirus 2 by RT PCR NEGATIVE NEGATIVE   Influenza A by PCR NEGATIVE NEGATIVE   Influenza B by PCR NEGATIVE NEGATIVE    MDM UA Tylenol, Reglan PO Zofran ODT  Patient reports complete resolution of symptoms  Assessment and Plan   1. Pregnancy headache in third trimester   2. [redacted] weeks gestation of pregnancy   3. Suspected COVID-19 virus infection    -Discharge home in stable condition -Rx for zofran sent to patient's pharmacy -Symptomatic treatment precautions discussed -Patient advised to follow-up with OB as scheduled for prenatal care -Patient may return to MAU as needed or if her condition were to change or worsen   Rolm Bookbinder CNM 11/24/2020, 8:05 AM

## 2020-11-24 NOTE — MAU Note (Signed)
RN went to get pt from lobby for Triage. Pt sitting in w/c with emesis bag to her face.Signifcant other was pushing toward Triage door but pt had him stop.  Pt had some dry heaves and then got up from w/c and went to BR in lobby.

## 2020-11-28 DIAGNOSIS — O3663X0 Maternal care for excessive fetal growth, third trimester, not applicable or unspecified: Secondary | ICD-10-CM | POA: Diagnosis not present

## 2020-11-28 DIAGNOSIS — O24419 Gestational diabetes mellitus in pregnancy, unspecified control: Secondary | ICD-10-CM | POA: Diagnosis not present

## 2020-11-28 DIAGNOSIS — Z3A36 36 weeks gestation of pregnancy: Secondary | ICD-10-CM | POA: Diagnosis not present

## 2020-11-30 ENCOUNTER — Telehealth (HOSPITAL_COMMUNITY): Payer: Self-pay | Admitting: *Deleted

## 2020-11-30 NOTE — Telephone Encounter (Signed)
Preadmission screen  

## 2020-12-04 ENCOUNTER — Other Ambulatory Visit: Payer: Self-pay | Admitting: Obstetrics and Gynecology

## 2020-12-04 DIAGNOSIS — O24419 Gestational diabetes mellitus in pregnancy, unspecified control: Secondary | ICD-10-CM | POA: Diagnosis not present

## 2020-12-04 DIAGNOSIS — O99213 Obesity complicating pregnancy, third trimester: Secondary | ICD-10-CM | POA: Diagnosis not present

## 2020-12-04 DIAGNOSIS — Z3A37 37 weeks gestation of pregnancy: Secondary | ICD-10-CM | POA: Diagnosis not present

## 2020-12-05 ENCOUNTER — Encounter (HOSPITAL_COMMUNITY): Payer: Self-pay | Admitting: Obstetrics and Gynecology

## 2020-12-05 ENCOUNTER — Other Ambulatory Visit: Payer: Self-pay

## 2020-12-05 ENCOUNTER — Inpatient Hospital Stay (HOSPITAL_COMMUNITY): Payer: BC Managed Care – PPO | Admitting: Anesthesiology

## 2020-12-05 ENCOUNTER — Inpatient Hospital Stay (HOSPITAL_COMMUNITY)
Admission: AD | Admit: 2020-12-05 | Discharge: 2020-12-08 | DRG: 787 | Disposition: A | Discharge status: Home / Self Care | Payer: BC Managed Care – PPO | Attending: Obstetrics and Gynecology | Admitting: Obstetrics and Gynecology

## 2020-12-05 ENCOUNTER — Inpatient Hospital Stay (HOSPITAL_COMMUNITY): Payer: BC Managed Care – PPO

## 2020-12-05 DIAGNOSIS — Z3A38 38 weeks gestation of pregnancy: Secondary | ICD-10-CM | POA: Diagnosis not present

## 2020-12-05 DIAGNOSIS — O134 Gestational [pregnancy-induced] hypertension without significant proteinuria, complicating childbirth: Secondary | ICD-10-CM | POA: Diagnosis present

## 2020-12-05 DIAGNOSIS — Z23 Encounter for immunization: Secondary | ICD-10-CM | POA: Diagnosis not present

## 2020-12-05 DIAGNOSIS — O24419 Gestational diabetes mellitus in pregnancy, unspecified control: Secondary | ICD-10-CM | POA: Diagnosis not present

## 2020-12-05 DIAGNOSIS — O24429 Gestational diabetes mellitus in childbirth, unspecified control: Secondary | ICD-10-CM | POA: Diagnosis not present

## 2020-12-05 DIAGNOSIS — O3663X0 Maternal care for excessive fetal growth, third trimester, not applicable or unspecified: Secondary | ICD-10-CM | POA: Diagnosis not present

## 2020-12-05 DIAGNOSIS — Z0542 Observation and evaluation of newborn for suspected metabolic condition ruled out: Secondary | ICD-10-CM | POA: Diagnosis not present

## 2020-12-05 DIAGNOSIS — O24425 Gestational diabetes mellitus in childbirth, controlled by oral hypoglycemic drugs: Principal | ICD-10-CM | POA: Diagnosis present

## 2020-12-05 DIAGNOSIS — O99214 Obesity complicating childbirth: Secondary | ICD-10-CM | POA: Diagnosis not present

## 2020-12-05 DIAGNOSIS — O9081 Anemia of the puerperium: Secondary | ICD-10-CM | POA: Diagnosis not present

## 2020-12-05 DIAGNOSIS — O99824 Streptococcus B carrier state complicating childbirth: Secondary | ICD-10-CM | POA: Diagnosis not present

## 2020-12-05 DIAGNOSIS — D62 Acute posthemorrhagic anemia: Secondary | ICD-10-CM | POA: Diagnosis not present

## 2020-12-05 DIAGNOSIS — Z20822 Contact with and (suspected) exposure to covid-19: Secondary | ICD-10-CM | POA: Diagnosis present

## 2020-12-05 LAB — GLUCOSE, CAPILLARY
Glucose-Capillary: 86 mg/dL (ref 70–99)
Glucose-Capillary: 89 mg/dL (ref 70–99)
Glucose-Capillary: 76 mg/dL (ref 70–99)
Glucose-Capillary: 82 mg/dL (ref 70–99)
Glucose-Capillary: 79 mg/dL (ref 70–99)

## 2020-12-05 LAB — CBC
HCT: 34.5 % — ABNORMAL LOW (ref 36.0–46.0)
Hemoglobin: 11.1 g/dL — ABNORMAL LOW (ref 12.0–15.0)
MCH: 25.7 pg — ABNORMAL LOW (ref 26.0–34.0)
MCHC: 32.2 g/dL (ref 30.0–36.0)
MCV: 79.9 fL — ABNORMAL LOW (ref 80.0–100.0)
Platelets: 253 10*3/uL (ref 150–400)
RBC: 4.32 MIL/uL (ref 3.87–5.11)
RDW: 17.2 % — ABNORMAL HIGH (ref 11.5–15.5)
WBC: 7.7 10*3/uL (ref 4.0–10.5)
nRBC: 0 % (ref 0.0–0.2)

## 2020-12-05 LAB — RESP PANEL BY RT-PCR (FLU A&B, COVID) ARPGX2
Influenza A by PCR: NEGATIVE
Influenza B by PCR: NEGATIVE
SARS Coronavirus 2 by RT PCR: NEGATIVE

## 2020-12-05 LAB — TYPE AND SCREEN
ABO/RH(D): O POS
Antibody Screen: NEGATIVE

## 2020-12-05 LAB — RPR: RPR Ser Ql: NONREACTIVE

## 2020-12-05 MED ORDER — EPHEDRINE 5 MG/ML INJ: 10.0000 mg | INTRAVENOUS | Status: DC | PRN

## 2020-12-05 MED ORDER — DIPHENHYDRAMINE HCL 50 MG/ML IJ SOLN: 12.5000 mg | INTRAMUSCULAR | Status: DC | PRN

## 2020-12-05 MED ORDER — LIDOCAINE HCL (PF) 1 % IJ SOLN
INTRAMUSCULAR | Status: DC | PRN
  Administered 2020-12-05: 10 mL via EPIDURAL
  Administered 2020-12-05: 2 mL via EPIDURAL

## 2020-12-05 MED ORDER — SODIUM CHLORIDE 0.9 % IV SOLN
5.0000 10*6.[IU] | Freq: Once | INTRAVENOUS | Status: AC
  Administered 2020-12-05: 5 10*6.[IU] via INTRAVENOUS
  Filled 2020-12-05: qty 5

## 2020-12-05 MED ORDER — MISOPROSTOL 25 MCG QUARTER TABLET
25.0000 ug | ORAL_TABLET | ORAL | Status: DC | PRN
  Administered 2020-12-05 (×2): 25 ug via VAGINAL
  Filled 2020-12-05 (×2): qty 1

## 2020-12-05 MED ORDER — OXYTOCIN-SODIUM CHLORIDE 30-0.9 UT/500ML-% IV SOLN: 2.5000 [IU]/h | INTRAVENOUS | Status: DC

## 2020-12-05 MED ORDER — ONDANSETRON HCL 4 MG/2ML IJ SOLN
4.0000 mg | Freq: Four times a day (QID) | INTRAMUSCULAR | Status: DC | PRN
  Administered 2020-12-06: 4 mg via INTRAVENOUS
  Filled 2020-12-05: qty 2

## 2020-12-05 MED ORDER — PHENYLEPHRINE 40 MCG/ML (10ML) SYRINGE FOR IV PUSH (FOR BLOOD PRESSURE SUPPORT): 80.0000 ug | PREFILLED_SYRINGE | INTRAVENOUS | Status: DC | PRN

## 2020-12-05 MED ORDER — PENICILLIN G POT IN DEXTROSE 60000 UNIT/ML IV SOLN
3.0000 10*6.[IU] | INTRAVENOUS | Status: DC
  Administered 2020-12-05 – 2020-12-06 (×6): 3 10*6.[IU] via INTRAVENOUS
  Filled 2020-12-05 (×6): qty 50

## 2020-12-05 MED ORDER — LIDOCAINE HCL (PF) 1 % IJ SOLN: 30.0000 mL | INTRAMUSCULAR | Status: DC | PRN

## 2020-12-05 MED ORDER — OXYTOCIN-SODIUM CHLORIDE 30-0.9 UT/500ML-% IV SOLN
1.0000 m[IU]/min | INTRAVENOUS | Status: DC
  Administered 2020-12-05: 2 m[IU]/min via INTRAVENOUS
  Filled 2020-12-05: qty 500

## 2020-12-05 MED ORDER — LACTATED RINGERS IV SOLN
500.0000 mL | INTRAVENOUS | Status: DC | PRN
  Administered 2020-12-05: 500 mL via INTRAVENOUS

## 2020-12-05 MED ORDER — FENTANYL-BUPIVACAINE-NACL 0.5-0.125-0.9 MG/250ML-% EP SOLN
12.0000 mL/h | EPIDURAL | Status: DC | PRN
  Filled 2020-12-05: qty 250

## 2020-12-05 MED ORDER — LACTATED RINGERS IV SOLN
500.0000 mL | Freq: Once | INTRAVENOUS | Status: AC
  Administered 2020-12-05: 500 mL via INTRAVENOUS

## 2020-12-05 MED ORDER — OXYCODONE-ACETAMINOPHEN 5-325 MG PO TABS: 1.0000 | ORAL_TABLET | ORAL | Status: DC | PRN

## 2020-12-05 MED ORDER — FENTANYL-BUPIVACAINE-NACL 0.5-0.125-0.9 MG/250ML-% EP SOLN
EPIDURAL | Status: DC | PRN
  Administered 2020-12-05: 12 mL/h via EPIDURAL

## 2020-12-05 MED ORDER — SOD CITRATE-CITRIC ACID 500-334 MG/5ML PO SOLN
30.0000 mL | ORAL | Status: DC | PRN
  Filled 2020-12-05: qty 30

## 2020-12-05 MED ORDER — BUTORPHANOL TARTRATE 1 MG/ML IJ SOLN: 1.0000 mg | INTRAMUSCULAR | Status: DC | PRN

## 2020-12-05 MED ORDER — ACETAMINOPHEN 325 MG PO TABS: 650.0000 mg | ORAL_TABLET | ORAL | Status: DC | PRN

## 2020-12-05 MED ORDER — OXYCODONE-ACETAMINOPHEN 5-325 MG PO TABS: 2.0000 | ORAL_TABLET | ORAL | Status: DC | PRN

## 2020-12-05 MED ORDER — LACTATED RINGERS IV SOLN: INTRAVENOUS | Status: DC

## 2020-12-05 MED ORDER — TERBUTALINE SULFATE 1 MG/ML IJ SOLN: 0.2500 mg | Freq: Once | INTRAMUSCULAR | Status: DC | PRN

## 2020-12-05 MED ORDER — OXYTOCIN BOLUS FROM INFUSION
333.0000 mL | Freq: Once | INTRAVENOUS | Status: DC
Start: 2020-12-05 — End: 2020-12-06

## 2020-12-05 NOTE — Progress Notes (Signed)
Patient spoke with anesthesia, patient IS able to get epidural (unknown back injury as a child however no evidence of scar, deemed able to get one). Contractions more painful now BP (!) 144/63   Pulse (!) 50   Temp 97.9 F (36.6 C) (Oral)   Resp 16   Ht 5\' 1"  (1.549 m)   Wt 92.6 kg   LMP 03/09/2020   SpO2 99%   BMI 38.58 kg/m  Clear AROM, copious clear fluid @ 1815. CE 2-3/70/-2, pitocin at 85mU/min  Encouraged once again regarding epidural and known LGA, still considering

## 2020-12-05 NOTE — Progress Notes (Signed)
Patient is s/p PV cytotec x2, Ce now 2/50/-3. Initiate and titrate pitocin per protocol BP 130/65   Pulse (!) 55   Temp 97.9 F (36.6 C) (Oral)   Resp 16   Ht 5\' 1"  (1.549 m)   Wt 92.6 kg   LMP 03/09/2020   SpO2 99%   BMI 38.58 kg/m   Considering epidural. Strongly advise this given LGA infant and concern for possible shoulder dystocia. Patient still considering

## 2020-12-05 NOTE — Progress Notes (Signed)
Blood sugar 89

## 2020-12-05 NOTE — Progress Notes (Signed)
CBG @0507 : 86

## 2020-12-05 NOTE — Progress Notes (Signed)
Blood sugar 76.

## 2020-12-05 NOTE — H&P (Addendum)
Shirley Gutierrez is a 20 y.o. female presenting for scheduled IOL. +FM, denies VB, LOF, having irr cramping  PNC c/b uncontrolled GDM on metformin. Diagnosed at 28wks, poor BG documentation/control, late start of metformin however in last 1.5wks BG WNL Last GS on 9/21: 3615g/89%tile, AFI 14, vtx, BPP 8/8 Additionally, patient with heartmumur at birth - s/p repair at , follows [facility] > fetal echo WNL  GBS pos, no PCN allergy OB History     Gravida  2   Para  0   Term  0   Preterm  0   AB  1   Living  0      SAB  1   IAB  0   Ectopic  0   Multiple  0   Live Births  0          Past Medical History:  Diagnosis Date   Headache    Heart murmur    when she was a child   Past Surgical History:  Procedure Laterality Date   HEART CHAMBER REVISION     closed hole in heart when seh was 2 months old   OVARIAN CYST REMOVAL     Family History: family history includes Diabetes in her father. Social History:  reports that she has never smoked. She has never used smokeless tobacco. She reports that she does not drink alcohol and does not use drugs.     Maternal Diabetes: Yes:  Diabetes Type:  Insulin/Medication controlled Genetic Screening: Declined Maternal Ultrasounds/Referrals: Normal Fetal Ultrasounds or other Referrals:  Fetal echo WNL Maternal Substance Abuse:  No Significant Maternal Medications:  None Significant Maternal Lab Results:  Group B Strep positive Other Comments:  None  Review of Systems  Constitutional:  Negative for chills and fever.  Respiratory:  Negative for shortness of breath.   Cardiovascular:  Negative for chest pain, palpitations and leg swelling.  Gastrointestinal:  Negative for abdominal pain, nausea and vomiting.  Neurological:  Negative for dizziness, weakness and headaches.  Psychiatric/Behavioral:  Negative for suicidal ideas.   Maternal Medical History:  Reason for admission: Nausea.   Dilation: 1 Effacement  (%): 50 Station: -3 Exam by:: Dr. Reina Fuse Blood pressure 130/65, pulse (!) 55, temperature 97.9 F (36.6 C), temperature source Oral, resp. rate 16, height 5\' 1"  (1.549 m), weight 92.6 kg, last menstrual period 03/09/2020, SpO2 99 %. Exam Physical Exam Constitutional:      General: She is not in acute distress.    Appearance: She is well-developed.  HENT:     Head: Normocephalic and atraumatic.  Eyes:     Pupils: Pupils are equal, round, and reactive to light.  Cardiovascular:     Rate and Rhythm: Normal rate and regular rhythm.     Heart sounds: No murmur heard.   No gallop.  Abdominal:     Tenderness: There is no abdominal tenderness. There is no guarding or rebound.  Genitourinary:    Vagina: Normal.  Musculoskeletal:        General: Normal range of motion.     Cervical back: Normal range of motion and neck supple.  Skin:    General: Skin is warm and dry.  Neurological:     Mental Status: She is alert and oriented to person, place, and time.    Prenatal labs: ABO, Rh: --/--/O POS (09/28 05-07-1980) Antibody: NEG (09/28 0417) Rubella: Immune (03/15 0000) RPR: Nonreactive (03/15 0000)  HBsAg: Negative (03/15 0000)  HIV: Non-reactive (03/15 0000)  GBS:  POS  Cat 1 tracing, TOCO q3-37m  Assessment/Plan: This is a 20yo G2P0010 @ 38 5/7 by LMP c/w 10wk TVUS admitted for IOL for uncontrolled GDM on PO metformin (poor compliance). GBS pos, already s/p PCN. 2nd PV cytotec placed, soft cervix, anterior, 1/50/-3. Plan to recheck, AROM when amenable and initiate pitocin per protocol. CBG q4hrs in latent labor. Discussion about LGA infant., concerns for possible shoulder dystocia/operative delivery/perineal lacerations/possible section  Carlisle Cater 12/05/2020, 9:13 AM

## 2020-12-05 NOTE — Anesthesia Procedure Notes (Signed)
Epidural Patient location during procedure: OB Start time: 12/05/2020 7:51 PM End time: 12/05/2020 8:03 PM  Staffing Anesthesiologist: Lannie Fields, DO Performed: anesthesiologist   Preanesthetic Checklist Completed: patient identified, IV checked, risks and benefits discussed, monitors and equipment checked, pre-op evaluation and timeout performed  Epidural Patient position: sitting Prep: DuraPrep and site prepped and draped Patient monitoring: continuous pulse ox, blood pressure, heart rate and cardiac monitor Approach: midline Location: L3-L4 Injection technique: LOR air  Needle:  Needle type: Tuohy  Needle gauge: 17 G Needle length: 9 cm Needle insertion depth: 7 cm Catheter type: closed end flexible Catheter size: 19 Gauge Catheter at skin depth: 12 cm Test dose: negative  Assessment Sensory level: T8 Events: blood not aspirated, injection not painful, no injection resistance, no paresthesia and negative IV test  Additional Notes Patient identified. Risks/Benefits/Options discussed with patient including but not limited to bleeding, infection, nerve damage, paralysis, failed block, incomplete pain control, headache, blood pressure changes, nausea, vomiting, reactions to medication both or allergic, itching and postpartum back pain. Confirmed with bedside nurse the patient's most recent platelet count. Confirmed with patient that they are not currently taking any anticoagulation, have any bleeding history or any family history of bleeding disorders. Patient expressed understanding and wished to proceed. All questions were answered. Sterile technique was used throughout the entire procedure. Please see nursing notes for vital signs. Test dose was given through epidural catheter and negative prior to continuing to dose epidural or start infusion. Warning signs of high block given to the patient including shortness of breath, tingling/numbness in hands, complete motor  block, or any concerning symptoms with instructions to call for help. Patient was given instructions on fall risk and not to get out of bed. All questions and concerns addressed with instructions to call with any issues or inadequate analgesia.  Reason for block:procedure for pain

## 2020-12-05 NOTE — Progress Notes (Addendum)
Patient very comfortable s/p epidural, legs heavy, no complaints  BP (!) 129/58   Pulse (!) 54   Temp 98.3 F (36.8 C) (Oral)   Resp 16   Ht 5\' 1"  (1.549 m)   Wt 92.6 kg   LMP 03/09/2020   SpO2 100%   BMI 38.58 kg/m  Ce 3/90/-2, IUPC placed, pitocin at 56mU/min TOCO q2-19m  Continue to titrate pitocin per protocol to ensure adequacy, VSS. S/p AROM @ 1815

## 2020-12-05 NOTE — Anesthesia Preprocedure Evaluation (Signed)
Anesthesia Evaluation  Patient identified by MRN, date of birth, ID band Patient awake    Reviewed: Allergy & Precautions, Patient's Chart, lab work & pertinent test results  Airway Mallampati: II  TM Distance: >3 FB Neck ROM: Full    Dental no notable dental hx.    Pulmonary neg pulmonary ROS,    Pulmonary exam normal breath sounds clear to auscultation       Cardiovascular Normal cardiovascular exam Rhythm:Regular Rate:Normal  Hx "hole in heart" that was closed surgically as infant- unsure if ASD, VSD etc   Neuro/Psych  Headaches, negative psych ROS   GI/Hepatic negative GI ROS, Neg liver ROS,   Endo/Other  diabetes (non-compliant w/ metformin), GestationalObesity BMI 39  Renal/GU negative Renal ROS  negative genitourinary   Musculoskeletal negative musculoskeletal ROS (+)   Abdominal (+) + obese,   Peds negative pediatric ROS (+)  Hematology negative hematology ROS (+) hct 34.5, plt 253   Anesthesia Other Findings   Reproductive/Obstetrics (+) Pregnancy Infant LGA                             Anesthesia Physical Anesthesia Plan  ASA: 3  Anesthesia Plan: Epidural   Post-op Pain Management:    Induction:   PONV Risk Score and Plan: 2 and Treatment may vary due to age or medical condition  Airway Management Planned: Natural Airway  Additional Equipment: None  Intra-op Plan:   Post-operative Plan:   Informed Consent: I have reviewed the patients History and Physical, chart, labs and discussed the procedure including the risks, benefits and alternatives for the proposed anesthesia with the patient or authorized representative who has indicated his/her understanding and acceptance.       Plan Discussed with: CRNA  Anesthesia Plan Comments:         Anesthesia Quick Evaluation

## 2020-12-06 ENCOUNTER — Encounter (HOSPITAL_COMMUNITY)
Admission: AD | Disposition: A | Discharge status: Home / Self Care | Payer: Self-pay | Source: Home / Self Care | Attending: Obstetrics and Gynecology

## 2020-12-06 ENCOUNTER — Encounter (HOSPITAL_COMMUNITY): Payer: Self-pay | Admitting: Obstetrics and Gynecology

## 2020-12-06 LAB — CBC WITH DIFFERENTIAL/PLATELET
Abs Immature Granulocytes: 0.03 10*3/uL (ref 0.00–0.07)
Basophils Absolute: 0 10*3/uL (ref 0.0–0.1)
Basophils Relative: 0 %
Eosinophils Absolute: 0.2 10*3/uL (ref 0.0–0.5)
Eosinophils Relative: 2 %
HCT: 36 % (ref 36.0–46.0)
Hemoglobin: 11.2 g/dL — ABNORMAL LOW (ref 12.0–15.0)
Immature Granulocytes: 0 %
Lymphocytes Relative: 9 %
Lymphs Abs: 0.9 10*3/uL (ref 0.7–4.0)
MCH: 25.2 pg — ABNORMAL LOW (ref 26.0–34.0)
MCHC: 31.1 g/dL (ref 30.0–36.0)
MCV: 81.1 fL (ref 80.0–100.0)
Monocytes Absolute: 0.5 10*3/uL (ref 0.1–1.0)
Monocytes Relative: 5 %
Neutro Abs: 8.3 10*3/uL — ABNORMAL HIGH (ref 1.7–7.7)
Neutrophils Relative %: 84 %
Platelets: 231 10*3/uL (ref 150–400)
RBC: 4.44 MIL/uL (ref 3.87–5.11)
RDW: 17.4 % — ABNORMAL HIGH (ref 11.5–15.5)
WBC: 10 10*3/uL (ref 4.0–10.5)
nRBC: 0 % (ref 0.0–0.2)

## 2020-12-06 LAB — COMPREHENSIVE METABOLIC PANEL
ALT: 19 U/L (ref 0–44)
AST: 31 U/L (ref 15–41)
Albumin: 2.3 g/dL — ABNORMAL LOW (ref 3.5–5.0)
Alkaline Phosphatase: 207 U/L — ABNORMAL HIGH (ref 38–126)
Anion gap: 10 (ref 5–15)
BUN: 5 mg/dL — ABNORMAL LOW (ref 6–20)
CO2: 19 mmol/L — ABNORMAL LOW (ref 22–32)
Calcium: 8.8 mg/dL — ABNORMAL LOW (ref 8.9–10.3)
Chloride: 105 mmol/L (ref 98–111)
Creatinine, Ser: 0.67 mg/dL (ref 0.44–1.00)
GFR, Estimated: >60 mL/min (ref >60)
Glucose, Bld: 82 mg/dL (ref 70–99)
Potassium: 3.8 mmol/L (ref 3.5–5.1)
Sodium: 134 mmol/L — ABNORMAL LOW (ref 135–145)
Total Bilirubin: 0.7 mg/dL (ref 0.3–1.2)
Total Protein: 5.9 g/dL — ABNORMAL LOW (ref 6.5–8.1)

## 2020-12-06 LAB — PROTEIN / CREATININE RATIO, URINE
Creatinine, Urine: 51.17 mg/dL
Protein Creatinine Ratio: 0.18 mg/mg{Cre} — ABNORMAL HIGH (ref 0.00–0.15)
Total Protein, Urine: 9 mg/dL

## 2020-12-06 LAB — GLUCOSE, CAPILLARY
Glucose-Capillary: 79 mg/dL (ref 70–99)
Glucose-Capillary: 84 mg/dL (ref 70–99)
Glucose-Capillary: 74 mg/dL (ref 70–99)
Glucose-Capillary: 102 mg/dL — ABNORMAL HIGH (ref 70–99)

## 2020-12-06 SURGERY — Surgical Case
Anesthesia: Epidural

## 2020-12-06 MED ORDER — FENTANYL CITRATE (PF) 100 MCG/2ML IJ SOLN
25.0000 ug | INTRAMUSCULAR | Status: DC | PRN
  Administered 2020-12-06 (×2): 25 ug via INTRAVENOUS

## 2020-12-06 MED ORDER — TETANUS-DIPHTH-ACELL PERTUSSIS 5-2.5-18.5 LF-MCG/0.5 IM SUSY
0.5000 mL | PREFILLED_SYRINGE | Freq: Once | INTRAMUSCULAR | Status: DC
  Filled 2020-12-06: qty 0.5

## 2020-12-06 MED ORDER — ACETAMINOPHEN 10 MG/ML IV SOLN
1000.0000 mg | Freq: Once | INTRAVENOUS | Status: DC | PRN
Start: 2020-12-06 — End: 2020-12-06

## 2020-12-06 MED ORDER — DIBUCAINE (PERIANAL) 1 % EX OINT
1.0000 "application " | TOPICAL_OINTMENT | CUTANEOUS | Status: DC | PRN
  Filled 2020-12-06: qty 28

## 2020-12-06 MED ORDER — DEXAMETHASONE SODIUM PHOSPHATE 4 MG/ML IJ SOLN
INTRAMUSCULAR | Status: DC | PRN
  Administered 2020-12-06: 8 mg via INTRAVENOUS

## 2020-12-06 MED ORDER — KETOROLAC TROMETHAMINE 30 MG/ML IJ SOLN: 30.0000 mg | Freq: Four times a day (QID) | INTRAMUSCULAR | Status: DC | PRN

## 2020-12-06 MED ORDER — SENNOSIDES-DOCUSATE SODIUM 8.6-50 MG PO TABS
2.0000 | ORAL_TABLET | ORAL | Status: DC
  Administered 2020-12-07: 2 via ORAL
  Filled 2020-12-06 (×2): qty 2

## 2020-12-06 MED ORDER — OXYTOCIN-SODIUM CHLORIDE 30-0.9 UT/500ML-% IV SOLN
INTRAVENOUS | Status: AC
  Filled 2020-12-06: qty 500

## 2020-12-06 MED ORDER — DIPHENHYDRAMINE HCL 25 MG PO CAPS: 25.0000 mg | ORAL_CAPSULE | ORAL | Status: DC | PRN

## 2020-12-06 MED ORDER — PHENYLEPHRINE 40 MCG/ML (10ML) SYRINGE FOR IV PUSH (FOR BLOOD PRESSURE SUPPORT)
PREFILLED_SYRINGE | INTRAVENOUS | Status: AC
  Filled 2020-12-06: qty 10

## 2020-12-06 MED ORDER — ACETAMINOPHEN 500 MG PO TABS
1000.0000 mg | ORAL_TABLET | Freq: Four times a day (QID) | ORAL | Status: DC
  Administered 2020-12-06 – 2020-12-08 (×6): 1000 mg via ORAL
  Filled 2020-12-06 (×6): qty 2

## 2020-12-06 MED ORDER — ZOLPIDEM TARTRATE 5 MG PO TABS: 5.0000 mg | ORAL_TABLET | Freq: Every evening | ORAL | Status: DC | PRN

## 2020-12-06 MED ORDER — SOD CITRATE-CITRIC ACID 500-334 MG/5ML PO SOLN
30.0000 mL | ORAL | Status: AC
  Administered 2020-12-06: 30 mL via ORAL

## 2020-12-06 MED ORDER — KETOROLAC TROMETHAMINE 30 MG/ML IJ SOLN
INTRAMUSCULAR | Status: AC
  Filled 2020-12-06: qty 1

## 2020-12-06 MED ORDER — NALOXONE HCL 0.4 MG/ML IJ SOLN: 0.4000 mg | INTRAMUSCULAR | Status: DC | PRN

## 2020-12-06 MED ORDER — SCOPOLAMINE 1 MG/3DAYS TD PT72
1.0000 | MEDICATED_PATCH | Freq: Once | TRANSDERMAL | Status: DC
  Administered 2020-12-06: 1.5 mg via TRANSDERMAL

## 2020-12-06 MED ORDER — SIMETHICONE 80 MG PO CHEW
80.0000 mg | CHEWABLE_TABLET | ORAL | Status: DC | PRN
  Filled 2020-12-06: qty 1

## 2020-12-06 MED ORDER — NALBUPHINE HCL 10 MG/ML IJ SOLN: 5.0000 mg | INTRAMUSCULAR | Status: DC | PRN

## 2020-12-06 MED ORDER — COCONUT OIL OIL
1.0000 "application " | TOPICAL_OIL | Status: DC | PRN
  Filled 2020-12-06: qty 120

## 2020-12-06 MED ORDER — NALBUPHINE HCL 10 MG/ML IJ SOLN: 5.0000 mg | Freq: Once | INTRAMUSCULAR | Status: DC | PRN

## 2020-12-06 MED ORDER — ONDANSETRON HCL 4 MG/2ML IJ SOLN: 4.0000 mg | Freq: Three times a day (TID) | INTRAMUSCULAR | Status: DC | PRN

## 2020-12-06 MED ORDER — HYDROMORPHONE HCL 1 MG/ML IJ SOLN
0.2000 mg | INTRAMUSCULAR | Status: DC | PRN
  Administered 2020-12-06: 0.2 mg via INTRAVENOUS
  Filled 2020-12-06: qty 1

## 2020-12-06 MED ORDER — NALOXONE HCL 4 MG/10ML IJ SOLN
1.0000 ug/kg/h | INTRAVENOUS | Status: DC | PRN
  Filled 2020-12-06: qty 5

## 2020-12-06 MED ORDER — DIPHENHYDRAMINE HCL 25 MG PO CAPS: 25.0000 mg | ORAL_CAPSULE | Freq: Four times a day (QID) | ORAL | Status: DC | PRN

## 2020-12-06 MED ORDER — DIPHENHYDRAMINE HCL 50 MG/ML IJ SOLN: 12.5000 mg | INTRAMUSCULAR | Status: DC | PRN

## 2020-12-06 MED ORDER — KETOROLAC TROMETHAMINE 30 MG/ML IJ SOLN
30.0000 mg | Freq: Four times a day (QID) | INTRAMUSCULAR | Status: AC
  Administered 2020-12-06 – 2020-12-07 (×3): 30 mg via INTRAVENOUS
  Filled 2020-12-06 (×2): qty 1

## 2020-12-06 MED ORDER — MORPHINE SULFATE (PF) 0.5 MG/ML IJ SOLN
INTRAMUSCULAR | Status: AC
  Filled 2020-12-06: qty 10

## 2020-12-06 MED ORDER — ACETAMINOPHEN 10 MG/ML IV SOLN
INTRAVENOUS | Status: DC | PRN
  Administered 2020-12-06: 1000 mg via INTRAVENOUS

## 2020-12-06 MED ORDER — LACTATED RINGERS IV SOLN: INTRAVENOUS | Status: DC | PRN

## 2020-12-06 MED ORDER — WITCH HAZEL-GLYCERIN EX PADS: 1.0000 "application " | MEDICATED_PAD | CUTANEOUS | Status: DC | PRN

## 2020-12-06 MED ORDER — MEPERIDINE HCL 25 MG/ML IJ SOLN
INTRAMUSCULAR | Status: DC | PRN
  Administered 2020-12-06 (×2): 12.5 mg via INTRAVENOUS

## 2020-12-06 MED ORDER — SODIUM CHLORIDE 0.9% FLUSH: 3.0000 mL | INTRAVENOUS | Status: DC | PRN

## 2020-12-06 MED ORDER — OXYTOCIN-SODIUM CHLORIDE 30-0.9 UT/500ML-% IV SOLN
INTRAVENOUS | Status: DC | PRN
  Administered 2020-12-06: 41.7 mL/h via INTRAVENOUS
  Administered 2020-12-06: 500 mL via INTRAVENOUS

## 2020-12-06 MED ORDER — OXYCODONE HCL 5 MG PO TABS
5.0000 mg | ORAL_TABLET | ORAL | Status: DC | PRN
  Administered 2020-12-07 (×2): 5 mg via ORAL
  Filled 2020-12-06 (×2): qty 1

## 2020-12-06 MED ORDER — ONDANSETRON HCL 4 MG/2ML IJ SOLN
INTRAMUSCULAR | Status: AC
  Filled 2020-12-06: qty 2

## 2020-12-06 MED ORDER — LACTATED RINGERS IV SOLN: INTRAVENOUS | Status: DC

## 2020-12-06 MED ORDER — IBUPROFEN 600 MG PO TABS
600.0000 mg | ORAL_TABLET | Freq: Four times a day (QID) | ORAL | Status: DC
  Administered 2020-12-07 – 2020-12-08 (×4): 600 mg via ORAL
  Filled 2020-12-06 (×4): qty 1

## 2020-12-06 MED ORDER — CEFAZOLIN SODIUM-DEXTROSE 2-3 GM-%(50ML) IV SOLR
INTRAVENOUS | Status: DC | PRN
  Administered 2020-12-06: 2 g via INTRAVENOUS

## 2020-12-06 MED ORDER — KETOROLAC TROMETHAMINE 30 MG/ML IJ SOLN: 30.0000 mg | Freq: Once | INTRAMUSCULAR | Status: DC | PRN

## 2020-12-06 MED ORDER — FENTANYL CITRATE (PF) 100 MCG/2ML IJ SOLN
INTRAMUSCULAR | Status: AC
  Filled 2020-12-06: qty 2

## 2020-12-06 MED ORDER — SCOPOLAMINE 1 MG/3DAYS TD PT72
MEDICATED_PATCH | TRANSDERMAL | Status: AC
  Filled 2020-12-06: qty 1

## 2020-12-06 MED ORDER — METOCLOPRAMIDE HCL 5 MG/ML IJ SOLN
INTRAMUSCULAR | Status: AC
  Filled 2020-12-06: qty 2

## 2020-12-06 MED ORDER — ACETAMINOPHEN 10 MG/ML IV SOLN
INTRAVENOUS | Status: AC
  Filled 2020-12-06: qty 100

## 2020-12-06 MED ORDER — CEFAZOLIN SODIUM-DEXTROSE 2-4 GM/100ML-% IV SOLN: 2.0000 g | INTRAVENOUS | Status: DC

## 2020-12-06 MED ORDER — DEXAMETHASONE SODIUM PHOSPHATE 4 MG/ML IJ SOLN
INTRAMUSCULAR | Status: AC
  Filled 2020-12-06: qty 1

## 2020-12-06 MED ORDER — MEPERIDINE HCL 25 MG/ML IJ SOLN
INTRAMUSCULAR | Status: AC
  Filled 2020-12-06: qty 1

## 2020-12-06 MED ORDER — OXYTOCIN-SODIUM CHLORIDE 30-0.9 UT/500ML-% IV SOLN: 2.5000 [IU]/h | INTRAVENOUS | Status: AC

## 2020-12-06 MED ORDER — METOCLOPRAMIDE HCL 5 MG/ML IJ SOLN
INTRAMUSCULAR | Status: DC | PRN
  Administered 2020-12-06: 10 mg via INTRAVENOUS

## 2020-12-06 MED ORDER — PRENATAL MULTIVITAMIN CH
1.0000 | ORAL_TABLET | Freq: Every day | ORAL | Status: DC
  Administered 2020-12-07: 1 via ORAL
  Filled 2020-12-06 (×2): qty 1

## 2020-12-06 MED ORDER — MORPHINE SULFATE (PF) 0.5 MG/ML IJ SOLN
INTRAMUSCULAR | Status: DC | PRN
  Administered 2020-12-06: 3 mg via EPIDURAL

## 2020-12-06 MED ORDER — ACETAMINOPHEN 500 MG PO TABS: 1000.0000 mg | ORAL_TABLET | Freq: Four times a day (QID) | ORAL | Status: DC

## 2020-12-06 MED ORDER — MENTHOL 3 MG MT LOZG
1.0000 | LOZENGE | OROMUCOSAL | Status: DC | PRN
  Filled 2020-12-06: qty 9

## 2020-12-06 MED ORDER — SIMETHICONE 80 MG PO CHEW
80.0000 mg | CHEWABLE_TABLET | Freq: Three times a day (TID) | ORAL | Status: DC
  Administered 2020-12-06 – 2020-12-08 (×4): 80 mg via ORAL
  Filled 2020-12-06 (×5): qty 1

## 2020-12-06 MED ORDER — SODIUM BICARBONATE 8.4 % IV SOLN
INTRAVENOUS | Status: DC | PRN
  Administered 2020-12-06: 10 mL via EPIDURAL

## 2020-12-06 MED ORDER — ONDANSETRON HCL 4 MG/2ML IJ SOLN
INTRAMUSCULAR | Status: DC | PRN
  Administered 2020-12-06: 4 mg via INTRAVENOUS

## 2020-12-06 MED ORDER — SODIUM CHLORIDE 0.9 % IV SOLN
500.0000 mg | INTRAVENOUS | Status: AC
  Administered 2020-12-06: 500 mg via INTRAVENOUS
  Filled 2020-12-06: qty 500

## 2020-12-06 SURGICAL SUPPLY — 30 items
BENZOIN TINCTURE PRP APPL 2/3 (GAUZE/BANDAGES/DRESSINGS) ×2 IMPLANT
CLAMP CORD UMBIL (MISCELLANEOUS) ×2 IMPLANT
CLOTH BEACON ORANGE TIMEOUT ST (SAFETY) ×2 IMPLANT
DRSG OPSITE POSTOP 4X10 (GAUZE/BANDAGES/DRESSINGS) ×2 IMPLANT
ELECT REM PT RETURN 9FT ADLT (ELECTROSURGICAL) ×2
ELECTRODE REM PT RTRN 9FT ADLT (ELECTROSURGICAL) ×1 IMPLANT
EXTRACTOR VACUUM BELL STYLE (SUCTIONS) ×2 IMPLANT
GLOVE SURG LTX SZ6 (GLOVE) ×2 IMPLANT
GLOVE SURG UNDER POLY LF SZ6.5 (GLOVE) ×2 IMPLANT
GOWN STRL REUS W/TWL LRG LVL3 (GOWN DISPOSABLE) ×4 IMPLANT
HEMOSTAT SURGICEL 4X8 (HEMOSTASIS) ×2 IMPLANT
KIT ABG SYR 3ML LUER SLIP (SYRINGE) ×2 IMPLANT
NEEDLE HYPO 25X5/8 SAFETYGLIDE (NEEDLE) ×2 IMPLANT
NS IRRIG 1000ML POUR BTL (IV SOLUTION) ×2 IMPLANT
PACK C SECTION WH (CUSTOM PROCEDURE TRAY) ×2 IMPLANT
PAD ABD DERMACEA PRESS 5X9 (GAUZE/BANDAGES/DRESSINGS) ×2 IMPLANT
PAD OB MATERNITY 4.3X12.25 (PERSONAL CARE ITEMS) ×2 IMPLANT
PENCIL SMOKE EVAC W/HOLSTER (ELECTROSURGICAL) ×2 IMPLANT
RTRCTR C-SECT PINK 25CM LRG (MISCELLANEOUS) ×2 IMPLANT
STRIP CLOSURE SKIN 1/2X4 (GAUZE/BANDAGES/DRESSINGS) ×2 IMPLANT
STRIP SURGICAL 1/4 X 6 IN (GAUZE/BANDAGES/DRESSINGS) ×2 IMPLANT
SUT MNCRL 0 VIOLET CTX 36 (SUTURE) ×2 IMPLANT
SUT MONOCRYL 0 CTX 36 (SUTURE) ×2
SUT VIC AB 0 CT1 36 (SUTURE) ×4 IMPLANT
SUT VIC AB 3-0 CT1 27 (SUTURE) ×2
SUT VIC AB 3-0 CT1 TAPERPNT 27 (SUTURE) ×2 IMPLANT
SUT VIC AB 4-0 KS 27 (SUTURE) ×2 IMPLANT
TOWEL OR 17X24 6PK STRL BLUE (TOWEL DISPOSABLE) ×2 IMPLANT
TRAY FOLEY W/BAG SLVR 14FR LF (SET/KITS/TRAYS/PACK) ×2 IMPLANT
WATER STERILE IRR 1000ML POUR (IV SOLUTION) ×2 IMPLANT

## 2020-12-06 NOTE — Op Note (Addendum)
CESAREAN SECTION Procedure Note  Patient: Shirley Gutierrez is a 20 y.o. G2P1011 @ [redacted]w[redacted]d  Preoperative Diagnosis:  Intrauterine pregnancy at 38 weeks 6 days Arrest of dilation Gestational diabetes, A2 Gestational hypertension   Postoperative Diagnosis: same, delivered  Procedure: Primary low transverse cesarean     Surgeon: Charlett Nose , MD  Anesthesia: Epidural anesthesia   Findings: Normal appearing uterus, fallopian tubes bilaterally, and ovaries bilaterally.  Viable female infant in right occiput posterior presentation delivered at 1404 with weight 8 lbs 10.1oz, Apgars 8 and 9.  Estimated Blood Loss:          Specimens: Placenta to L&D for disposal         Complications:  None         Disposition: PACU - hemodynamically stable.         Condition: stable    Description of Procedure: The patient was taken to the operating room where epidural anesthesia was rebolused and found to be adequate.  The patient was placed in the dorsal supine position.  Fetal heart tones were confirmed. Thromboguards were applied and cycling. A foley catheter was in place and draining. Ancef 2g and Azithromycin 500mg  were given for infection prophylaxis. The patient was subsequently prepped and draped in the normal sterile fashion.    A low transverse skin incision was made with a scalpel and carried down to the level of the fascia with the Bovie.  The fascia was incised in the midline with the scalpel and extended laterally with curved Mayo scissors.  Kocher clamps were applied to the inferior fascial edge and the fascia was dissected off the rectus muscle sharply using the Mayo scissors.  The Kocher clamps were transferred to the superior fascial edge and the underlying rectus muscle was dissected off with curved Mayo's scissors.  The rectus muscles then were separated in the midline.  The peritoneum was found free of adherent bowel and the peritoneal cavity was entered with Metzenbaum  scissors.  The uterus was identified and the alexis retractor was placed intraperitoneal.  A bladder flap was then created sharply with Metzenbaum scissors and separated from the lower uterine segment digitally.   A low transverse hysterotomy was then made with a scalpel.  The infant was found in the vertex presentation with right occiput posterior. The infant's head was delivered easily and the infant's shoulders were tight but delivered atraumatically with gentle traction and fundal pressure. After 60 seconds of delayed cord clamping the cord was clamped and cut and the infant was handed off to the pediatricians.  The placenta was delivered manually.  The uterus was cleared of all clot and debris.  The hysterotomy was then closed with 0 monocryl in a running locked fashion,  followed by 0 Monocryl in an imbricating fashion. Uterine tone poor at this point, pitocin run wide open with good uterine tone response. The hysterotomy was rendered hemostatic with cautery and Surgicel. The fascia was closed with a 0 Vicryl suture in a continuous running fashion.  The subcutaneous tissue was irrigated and rendered hemostatic with cautery.  The subcutaneous layer was subsequently closed with 3-0 Vicryl in a continuous running fashion.  The skin was closed with 4-0 vicryl  in a running subcuticular fashion.  Sponge, lap and needle counts were correct. Steri strips and a Honeycomb dressing were placed on the incision and a pressure dressing was applied  12/06/20  2:54 PM

## 2020-12-06 NOTE — Progress Notes (Signed)
OB Progress Note  S:Pt feeling increased rectal pressure, requesting exam   O: Today's Vitals   12/06/20 0731 12/06/20 0800 12/06/20 0826 12/06/20 0832  BP:  131/64  140/66  Pulse:  69  (!) 55  Resp:  18    Temp:   99.3 F (37.4 C)   TempSrc:   Oral   SpO2:      Weight:      Height:      PainSc: 4       Body mass index is 38.58 kg/m.  SVE 6/80/0, cervix edematous, ROP  FHR: 135bpm, moderate variability, + accels, no decels Toco: ctx q 2-4 mins, MVUs >200   A/P: 20Y G2P0010 @ [redacted]w[redacted]d, IOL for GDMA2 Fetal wellbeing: cat I tracing Labor: unchanged from 0700 exam, with swelling of cervix, despite adequate contractions, on pitocin at 18, membranes ruptured. Discussed findings with patient and her family. Plan to recheck around 1100 and if unchanged at that point, would recommend c/s. Continue to titrate pitocin.  GDMA2: CBGs within normal limits Gestational HTN: by mild range Bps, normal labs this AM Pain control: epidural in place  M. Timothy Lasso, MD 12/06/20 9:38 AM

## 2020-12-06 NOTE — Lactation Note (Signed)
This note was copied from a baby's chart. Lactation Consultation Note  Patient Name: Shirley Gutierrez [id number] Date: 12/06/2020 Reason for consult: Initial assessment;Primapara;1st time breastfeeding;Difficult latch;Early term 37-38.6wks;Other (Comment);Breastfeeding assistance (challenging tissue - areolas semi compressible to start / reverse pressure helped and baby latched for 4 mins swallows .) Age:22 hours- Latch score 6  Mom diet control DM - baby's blood sugar 56   Maternal Data Has patient been taught Hand Expression?: Yes Does the patient have breastfeeding experience prior to this delivery?: No  Feeding Mother's Current Feeding Choice: Breast Milk  LATCH Score Latch: Repeated attempts needed to sustain latch, nipple held in mouth throughout feeding, stimulation needed to elicit sucking reflex.  Audible Swallowing: A few with stimulation  Type of Nipple: Everted at rest and after stimulation  Comfort (Breast/Nipple): Soft / non-tender  Hold (Positioning): Full assist, staff holds infant at breast  LATCH Score: 6   Lactation Tools Discussed/Used Tools: Pump;Flanges;Shells [facility] provided and instructed mom on use of shells / hand pump) Flange Size: 24 Breast pump type: Manual  Interventions Interventions: Breast feeding basics reviewed;Assisted with latch;Skin to skin;Hand express;Reverse pressure;Breast compression;Adjust position;Support pillows;Position options;Shells;Hand pump;Education  Discharge    Consult Status Consult Status: Follow-up Date: 12/07/20 Follow-up type: In-patient    Matilde Sprang Janya Eveland 12/06/2020, 6:17 PM

## 2020-12-06 NOTE — Progress Notes (Signed)
Comfortable w/ epidural, feels some increasing rectal pressure Has had occ elevated but non-severe BP, denies PreE symptoms  BP 126/71   Pulse (!) 53   Temp 98.4 F (36.9 C) (Oral)   Resp 16   Ht 5\' 1"  (1.549 m)   Wt 92.6 kg   LMP 03/09/2020   SpO2 100%   BMI 38.58 kg/m  CE 6/90/0, pitocin at 16 mU/min, MVUS 180s TOCO q2-76m, cat 1 tracing  Now entering active labor. PreE labs obtained (CBC, uPC) WNL, CMP pending.

## 2020-12-06 NOTE — Progress Notes (Addendum)
SVE 6/80/0 anterior cervix edematous, exam unchanged.  Fetal heart tracing category 1  Patient agrees to proceed with primary cesarean for arrest of dilation. Reviewed risks of infection, bleeding, blood transfusion, hysterectomy, damage to surrounding organs. All questions answered.  NPO Plan for Ancef 2g and Azithromycin 500mg  OR notified, plan to go back around 1pm Per patient request, will recheck SVE prior to proceeding to OR  M. , MD 12/06/20 11:54 AM   ADDENDUM Repeat exam unchanged. Fetal tracing still cat I. Pit off. Proceed to OR for c/s.    12/08/20, MD 12/06/20 1:06 PM

## 2020-12-06 NOTE — Progress Notes (Signed)
OB Progress Note  S:Pt comfortable   O: Today's Vitals   12/06/20 0800 12/06/20 0826 12/06/20 0832 12/06/20 1059  BP: 131/64  140/66 (!) 143/80  Pulse: 69  (!) 55 71  Resp: 18     Temp:  99.3 F (37.4 C)    TempSrc:  Oral    SpO2:      Weight:      Height:      PainSc:       Body mass index is 38.58 kg/m.  SVE 6/80/0, unchanged   FHR: 135bpm, moderate variability, + accels, no decels Toco: ctx q 2-4   A/P: 20Y G2P0010 @ [redacted]w[redacted]d, IOL for GDMA2 Fetal wellbeing: cat I tracing Labor: still unchanged from 0700 exam, with swelling of cervix, despite adequate contractions, on pitocin at 18, membranes ruptured. Recommend primary cesarean for arrest of dilation. Patient requesting 30 more mins and 1 more recheck at that time. Given cat I tracing, will recheck at 11:30.  GDMA2: CBGs within normal limits Gestational HTN: by mild range Bps, normal labs this AM Pain control: epidural in place  M. Timothy Lasso, MD 12/06/20 11:18 AM

## 2020-12-06 NOTE — Transfer of Care (Signed)
Immediate Anesthesia Transfer of Care Note  Patient: Shirley Gutierrez  Procedure(s) Performed: CESAREAN SECTION  Patient Location: PACU  Anesthesia Type:Epidural  Level of Consciousness: awake, alert  and oriented  Airway & Oxygen Therapy: Patient Spontanous Breathing  Post-op Assessment: Report given to RN and Post -op Vital signs reviewed and stable  Post vital signs: Reviewed and stable  Last Vitals:  Vitals Value Taken Time  BP 142/75 12/06/20 1509  Temp    Pulse 73 12/06/20 1511  Resp 21 12/06/20 1511  SpO2 99 % 12/06/20 1511  Vitals shown include unvalidated device data.  Last Pain:  Vitals:   12/06/20 0826  TempSrc: Oral  PainSc:       Patients Stated Pain Goal: 0 (12/05/20 1940)  Complications: No notable events documented.

## 2020-12-06 NOTE — Anesthesia Postprocedure Evaluation (Signed)
Anesthesia Post Note  Patient: Shirley Gutierrez  Procedure(s) Performed: CESAREAN SECTION     Patient location during evaluation: PACU Anesthesia Type: Epidural Level of consciousness: oriented and awake and alert Pain management: pain level controlled Vital Signs Assessment: post-procedure vital signs reviewed and stable Respiratory status: spontaneous breathing, respiratory function stable and patient connected to nasal cannula oxygen Cardiovascular status: blood pressure returned to baseline and stable Postop Assessment: no headache, no backache, no apparent nausea or vomiting and epidural receding Anesthetic complications: no   No notable events documented.  Last Vitals:  Vitals:   12/06/20 1615 12/06/20 1627  BP: 135/73 132/79  Pulse: 63 62  Resp: 19 16  Temp:  36.9 C  SpO2: 95% 98%    Last Pain:  Vitals:   12/06/20 1627  TempSrc: Oral  PainSc:    Pain Goal: Patients Stated Pain Goal: 0 (12/05/20 1940)              Epidural/Spinal Function Cutaneous sensation: Able to Wiggle Toes (12/06/20 1630), Patient able to flex knees: Yes (12/06/20 1630), Patient able to lift hips off bed: No (12/06/20 1630), Back pain beyond tenderness at insertion site: No (12/06/20 1630), Progressively worsening motor and/or sensory loss: No (12/06/20 1630), Bowel and/or bladder incontinence post epidural: No (12/06/20 1630)  Helton Oleson L Jeffre Enriques

## 2020-12-07 LAB — CBC
HCT: 25.4 % — ABNORMAL LOW (ref 36.0–46.0)
Hemoglobin: 7.9 g/dL — ABNORMAL LOW (ref 12.0–15.0)
MCH: 25 pg — ABNORMAL LOW (ref 26.0–34.0)
MCHC: 31.1 g/dL (ref 30.0–36.0)
MCV: 80.4 fL (ref 80.0–100.0)
Platelets: 179 10*3/uL (ref 150–400)
RBC: 3.16 MIL/uL — ABNORMAL LOW (ref 3.87–5.11)
RDW: 17.3 % — ABNORMAL HIGH (ref 11.5–15.5)
WBC: 9.3 10*3/uL (ref 4.0–10.5)
nRBC: 0 % (ref 0.0–0.2)

## 2020-12-07 LAB — GLUCOSE, CAPILLARY
Glucose-Capillary: 63 mg/dL — ABNORMAL LOW (ref 70–99)
Glucose-Capillary: 80 mg/dL (ref 70–99)

## 2020-12-07 LAB — GLUCOSE, RANDOM: Glucose, Bld: 80 mg/dL (ref 70–99)

## 2020-12-07 MED ORDER — ONDANSETRON HCL 4 MG PO TABS
4.0000 mg | ORAL_TABLET | ORAL | Status: DC | PRN
  Administered 2020-12-07 – 2020-12-08 (×2): 4 mg via ORAL
  Filled 2020-12-07 (×2): qty 1

## 2020-12-07 NOTE — Lactation Note (Signed)
This note was copied from a baby's chart. Lactation Consultation Note  Patient Name: Shirley Gutierrez [id number] Date: 12/07/2020 Reason for consult: Follow-up assessment;1st time breastfeeding;Early term 37-38.6wks Age:22 hours LC entered the room, infant asleep in basinet. Per mom, infant is latching well, most feedings are 20 to 30 minutes in length. LC did not observe latch at this time, per mom, infant recently breastfeed for 30 minutes prior to [facility] entering the room. Mom knows to breastfeed infant according to hunger cues, 8 to 12 times within 24 hours, skin to skin. Mom knows to call RN/LC if she has any breastfeeding questions, concerns or needs assistance with latching infant at the breast. Maternal Data    Feeding Mother's Current Feeding Choice: Breast Milk  LATCH Score                    Lactation Tools Discussed/Used    Interventions    Discharge    Consult Status Consult Status: Follow-up Date: 12/07/20 Follow-up type: In-patient    Danelle Earthly 12/07/2020, 2:24 AM

## 2020-12-07 NOTE — Progress Notes (Signed)
Pt c/o nausea. Rating pain 7/10. Medicated with scheduled tylenol and motrin. Dr. Chestine Spore called as only nausea medication is IV and pt is without IV access. Order received.

## 2020-12-07 NOTE — Lactation Note (Signed)
This note was copied from a baby's chart. Lactation Consultation Note  Patient Name: Shirley Gutierrez [id number] Date: 12/07/2020 Reason for consult: Follow-up assessment;Mother's request Age:22 hours Mom's current feeding choice is breast and formula feeding. LC entered the room, per mom it is time for feeding, infant was crying and cuing to feed. LC did such training to calm infant down and explained to parents infant is cluster feeding after 24 hours of life.  LC asked mom to pre-pump her breast prior to latching infant due to having flat nipples. Infant latched with depth on mom's right breast using the football hold position, infant sustained latch during the feeding, and breastfeed for 11 minutes. Afterwards infant was supplemented with 10 mls of formula ( mom's choice) by MGM, LC discussed pace feeding with family.  Mom hasn't been breastfeeding infant skin to skin, LC discussed importance of skin to skin and recognizing infant's feeding cues. Mom's plan: 1- Mom pre-pump breast with hand pump prior to latching infant at the breast. 2- After pre-pumping breast , mom will latching infant at breast, feeding infant according to feeding cues, 8 to 12+ times within 24 hours, skin to skin. 3- After latching infant at the breast, mom will supplement infant with formula her choice, mom has breastfeeding supplement sheet.  4- Mom knows to call RN/LC for further latch assistance if needed. 5- Mom will start latching infant on both breast during a feeding and increase breastfeeding duration Maternal Data     Feeding Mother's Current Feeding Choice: Breast Milk and Formula Nipple Type: Slow - flow  LATCH Score Latch: Grasps breast easily, tongue down, lips flanged, rhythmical sucking.  Audible Swallowing: Spontaneous and intermittent  Type of Nipple: Flat  Comfort (Breast/Nipple): Soft / non-tender  Hold (Positioning): Assistance needed to correctly position infant at breast and  maintain latch.  LATCH Score: 8   Lactation Tools Discussed/Used Tools: Pump Flange Size: 24  Interventions Interventions: Skin to skin;Hand express;Pre-pump if needed;Breast compression;Adjust position;Support pillows;Position options;Expressed milk;Hand pump;Education  Discharge Pump: Manual  Consult Status Consult Status: Follow-up Date: 12/08/20 Follow-up type: In-patient    Danelle Earthly 12/07/2020, 6:02 PM

## 2020-12-07 NOTE — Progress Notes (Signed)
Patient is doing well.  She is tolerating PO, ambulating, voiding.  Pain is controlled.  Lochia is appropriate  Vitals:   12/06/20 1846 12/06/20 1945 12/06/20 2341 12/07/20 0608  BP: 127/63 133/76  113/61  Pulse: (!) 55 67  64  Resp: 16 16 16 15   Temp: 98.2 F (36.8 C) 98.1 F (36.7 C) 98.5 F (36.9 C) 97.6 F (36.4 C)  TempSrc: Oral Oral Oral Oral  SpO2: 98% 99% 100% 99%  Weight:      Height:        NAD Abdomen:  soft, appropriate tenderness, large pressure dressing in place ext:    Symmetric, 1+ edema bilaterally  Lab Results  Component Value Date   WBC 9.3 12/07/2020   HGB 7.9 (L) 12/07/2020   HCT 25.4 (L) 12/07/2020   MCV 80.4 12/07/2020   PLT 179 12/07/2020    --/--/O POS (09/28 0417)/RImmune  A/P    20 y.o. G2P1011 POD 1 s/p primary cesarean section for arrest of dilation GDMA2: fasting today 80 ABLA--EBL at time of cesarean section 05-07-1980.  Post op hemoglobin 7.9 from 11.2.  Will plan to discharge with PO iron GHTN--diagnosed during IOL.  Has been normotensive since delivery.  Will continue close monitoring Routine post op and postpartum care.  Removed pressure dressing after shower this afternoon. Anticipate discharge tomorrow

## 2020-12-08 MED ORDER — IBUPROFEN 600 MG PO TABS: 600.0000 mg | ORAL_TABLET | Freq: Four times a day (QID) | ORAL | 0 refills | Status: DC

## 2020-12-08 MED ORDER — OXYCODONE HCL 5 MG PO TABS: 5.0000 mg | ORAL_TABLET | ORAL | 0 refills | Status: DC | PRN

## 2020-12-08 NOTE — Progress Notes (Signed)
POD #2 LTCS Doing well, some nausea, wants to go home today Afeb, VSS Abd- soft, fundus firm, incision intact D/c home

## 2020-12-08 NOTE — Discharge Summary (Signed)
Postpartum Discharge Summary      Patient Name: Shirley Gutierrez DOB: [birth year] MRN: [id number]  Date of admission: 12/05/2020 Delivery date:12/06/2020  Delivering provider: Derl Barrow E  Date of discharge: 12/08/2020  Admitting diagnosis: Gestational diabetes [O24.419] Intrauterine pregnancy: [redacted]w[redacted]d     Secondary diagnosis:  Active Problems:   Gestational diabetes    Discharge diagnosis: Term Pregnancy Delivered, Gestational Hypertension, GDM A2, and arrest of dilation                                                Hospital course: Induction of Labor With Cesarean Section   20 y.o. yo G2P1011 at 103w6d was admitted to the hospital 12/05/2020 for induction of labor. Patient had a labor course significant for protracted labor, mild elevated BP. The patient went for cesarean section due to Arrest of Dilation. Delivery details are as follows: Membrane Rupture Time/Date: 6:16 PM ,12/05/2020   Delivery Method:C-Section, Low Transverse  Details of operation can be found in separate operative Note.  Patient had an uncomplicated postpartum course. BP stable without Rx.  She is ambulating, tolerating a regular diet, passing flatus, and urinating well.  Patient is discharged home in stable condition on 12/08/20.      Newborn Data: Birth date:12/06/2020  Birth time:2:04 PM  Gender:Female  Living status:Living  Apgars:8 ,9  Weight:3915 g                                 Physical exam  Vitals:   12/07/20 1002 12/07/20 1431 12/07/20 2131 12/08/20 0505  BP: 100/63 110/60 (!) 112/55 130/70  Pulse: 61 64 64 62  Resp: 16 18 17 20   Temp: 97.7 F (36.5 C) 98 F (36.7 C) 98.4 F (36.9 C) 98.2 F (36.8 C)  TempSrc: Oral Oral Oral Oral  SpO2: 98% 99% 99% 99%  Weight:      Height:       General: alert Lochia: appropriate Uterine Fundus: firm Incision: Healing well with no significant drainage  Labs: Lab Results  Component Value Date   WBC 9.3 12/07/2020   HGB 7.9 (L)  12/07/2020   HCT 25.4 (L) 12/07/2020   MCV 80.4 12/07/2020   PLT 179 12/07/2020   CMP Latest Ref Rng & Units 12/07/2020  Glucose 70 - 99 mg/dL 80  BUN 6 - 20 mg/dL -  Creatinine 12/09/2020 - 5.57 mg/dL -  Sodium 3.22 - 025 mmol/L -  Potassium 3.5 - 5.1 mmol/L -  Chloride 98 - 111 mmol/L -  CO2 22 - 32 mmol/L -  Calcium 8.9 - 10.3 mg/dL -  Total Protein 6.5 - 8.1 g/dL -  Total Bilirubin 0.3 - 1.2 mg/dL -  Alkaline Phos 38 - 427 U/L -  AST 15 - 41 U/L -  ALT 0 - 44 U/L -   Edinburgh Score: Edinburgh Postnatal Depression Scale Screening Tool 12/06/2020  I have been able to laugh and see the funny side of things. (No Data)      After visit meds:  Allergies as of 12/08/2020       Reactions   Peanut-containing Drug Products Other (See Comments)   Promethazine Itching, Nausea Only, Other (See Comments)   Drowsy. Drowsy. Drowsy. Drowsy. Drowsy.        Medication List  TAKE these medications    cetirizine 10 MG tablet Commonly known as: ZyrTEC Allergy Take 1 tablet (10 mg total) by mouth daily.   ibuprofen 600 MG tablet Commonly known as: ADVIL Take 1 tablet (600 mg total) by mouth every 6 (six) hours.   ondansetron 8 MG tablet Commonly known as: ZOFRAN Take 1 tablet (8 mg total) by mouth 2 (two) times daily.   oxyCODONE 5 MG immediate release tablet Commonly known as: Oxy IR/ROXICODONE Take 1 tablet (5 mg total) by mouth every 4 (four) hours as needed for severe pain.   prenatal multivitamin Tabs tablet Take 1 tablet by mouth daily at 12 noon.         Discharge home in stable condition Infant Feeding: Breast Infant Disposition:home with mother Discharge instruction: per After Visit Summary and Postpartum booklet. Activity: Advance as tolerated. Pelvic rest for 6 weeks.  Diet: routine diet Postpartum Appointment:2 weeks Follow up Visit:  Follow-up Information     Shivaji, Valerie Roys, MD. Schedule an appointment as soon as possible for a visit in 2  week(s).   Specialty: Obstetrics and Gynecology Contact information: [address] [place] [address] [phone]                     12/08/2020 Zenaida Niece, MD

## 2020-12-08 NOTE — Discharge Instructions (Signed)
As per discharge pamphlet °

## 2020-12-18 ENCOUNTER — Telehealth (HOSPITAL_COMMUNITY): Payer: Self-pay | Admitting: *Deleted

## 2020-12-18 NOTE — Telephone Encounter (Signed)
Attempted Hospital Discharge Follow-Up Call.  Voice mail box full.  Unable to leave message.

## 2020-12-19 DIAGNOSIS — Z09 Encounter for follow-up examination after completed treatment for conditions other than malignant neoplasm: Secondary | ICD-10-CM | POA: Diagnosis not present

## 2021-01-15 DIAGNOSIS — Z1389 Encounter for screening for other disorder: Secondary | ICD-10-CM | POA: Diagnosis not present

## 2021-01-15 DIAGNOSIS — Z3009 Encounter for other general counseling and advice on contraception: Secondary | ICD-10-CM | POA: Diagnosis not present

## 2021-01-15 DIAGNOSIS — Z8632 Personal history of gestational diabetes: Secondary | ICD-10-CM | POA: Diagnosis not present

## 2022-01-17 IMAGING — US US OB TRANSVAGINAL
1 series · 15 of 28 positions shown · non-contrast
Comparison: Ultrasound dated 08/28/2019.

CLINICAL DATA: 19-year-old pregnant female with vaginal bleeding.
LMP: 06/25/2019 corresponding to an estimated gestational age of 9
weeks, 2 days.



[Series 1: us ob transvaginal · 35 acquisitions, 15 frames shown]
[im 1/35]
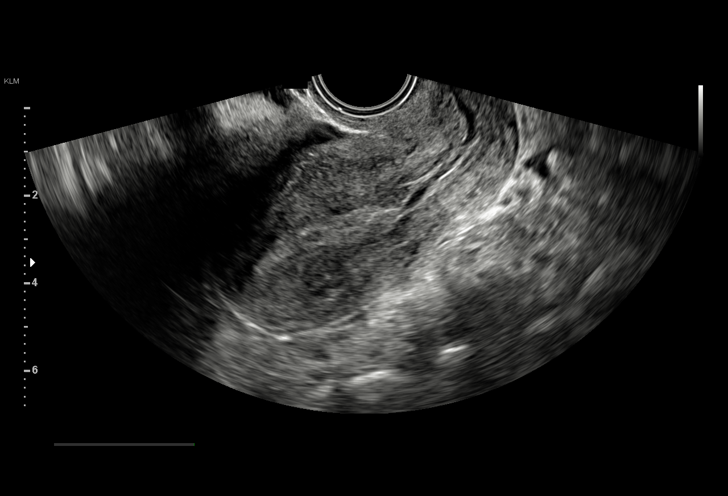
[im 3/35]
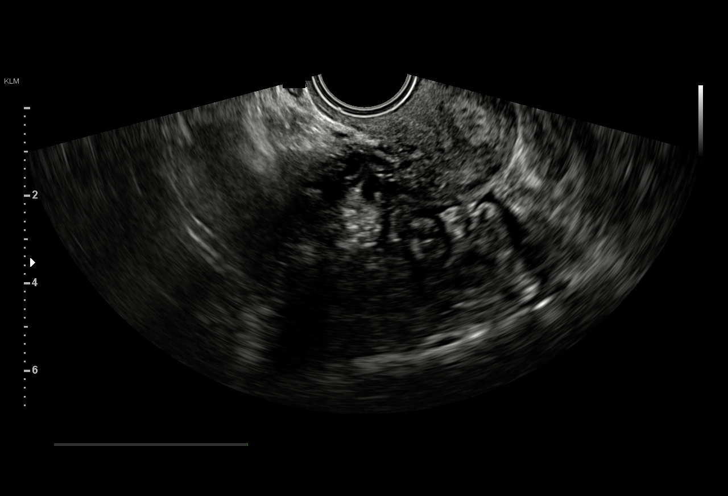
[im 6/35]
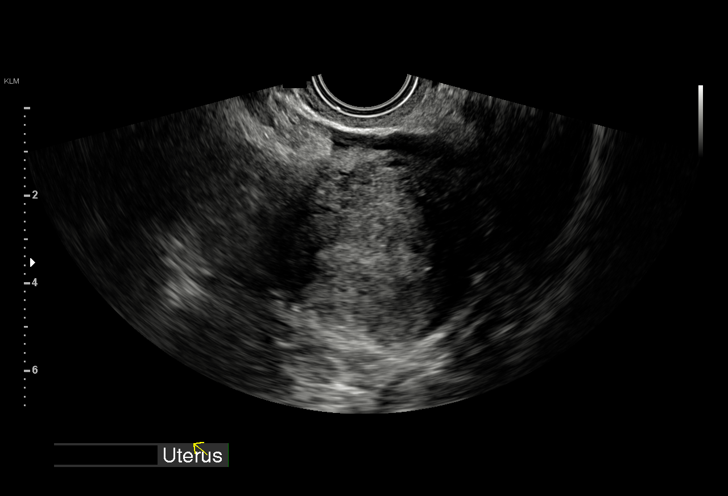
[im 8/35]
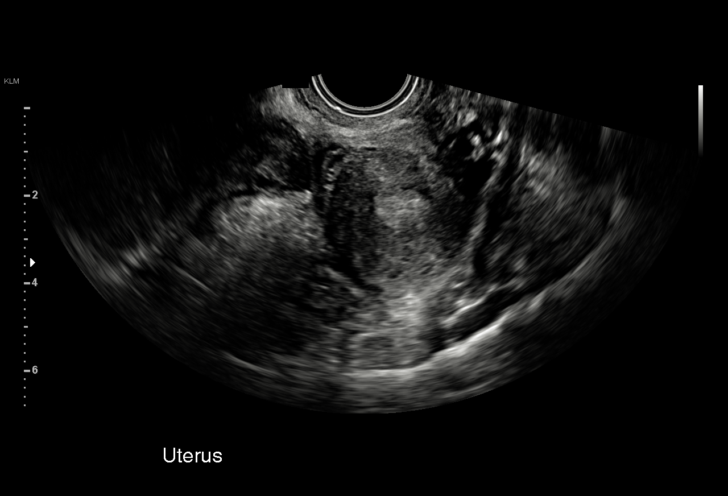
[im 11/35]
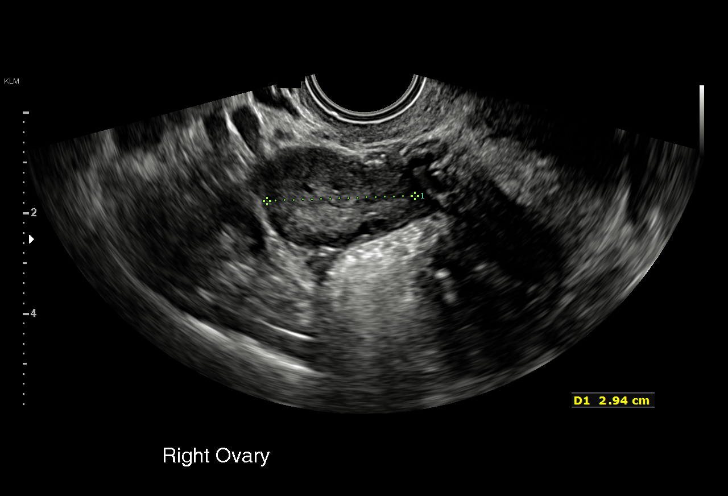
[im 13/35]
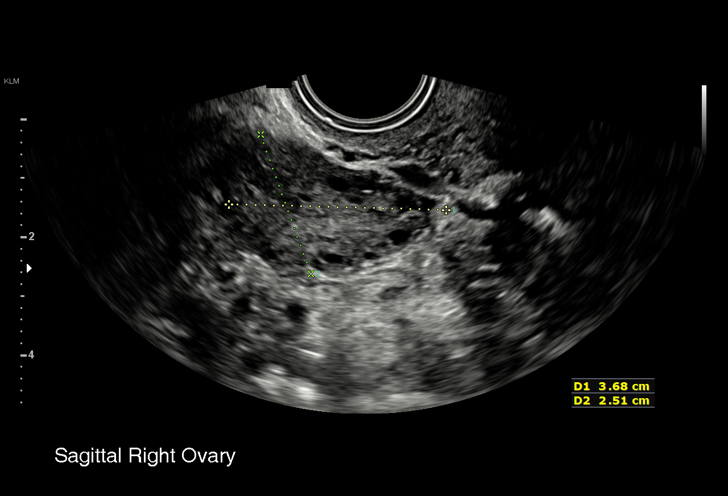
[im 16/35]
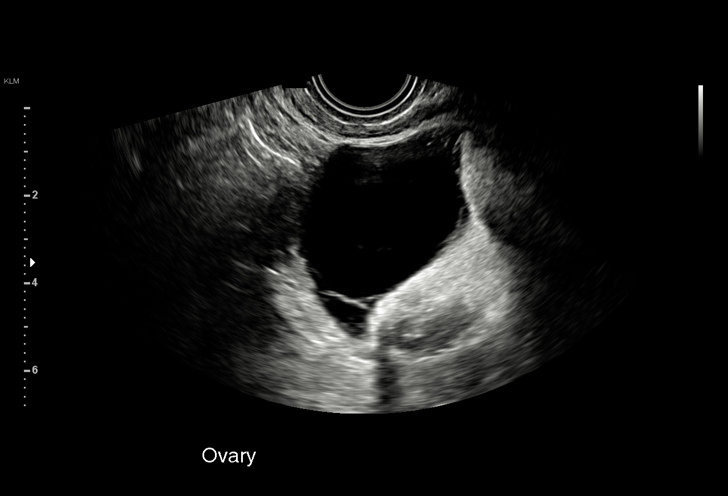
[im 18/35]
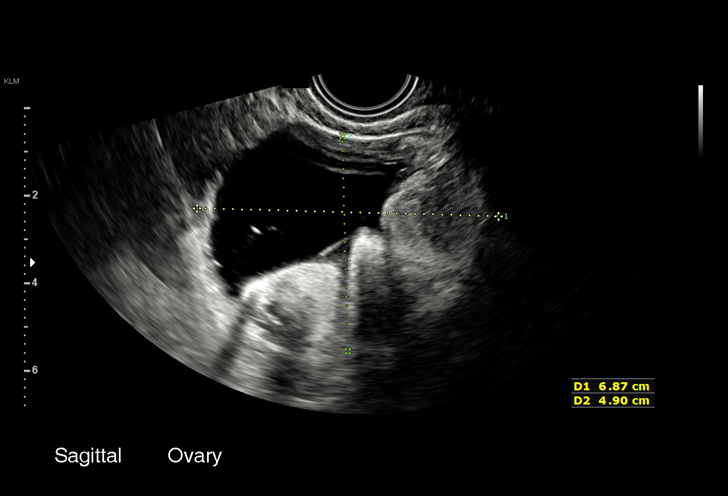
[im 19/35]
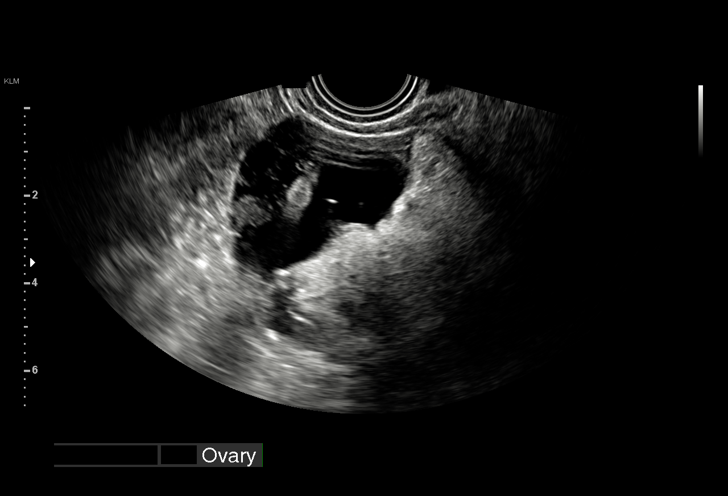
[im 22/35]
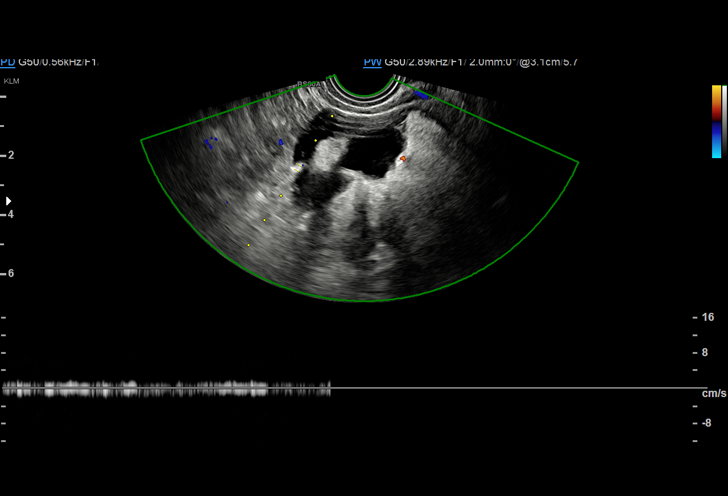
[im 24/35]
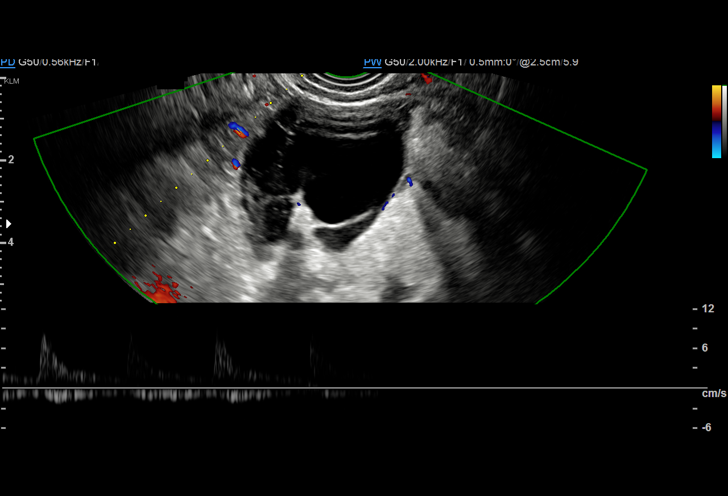
[im 27/35]
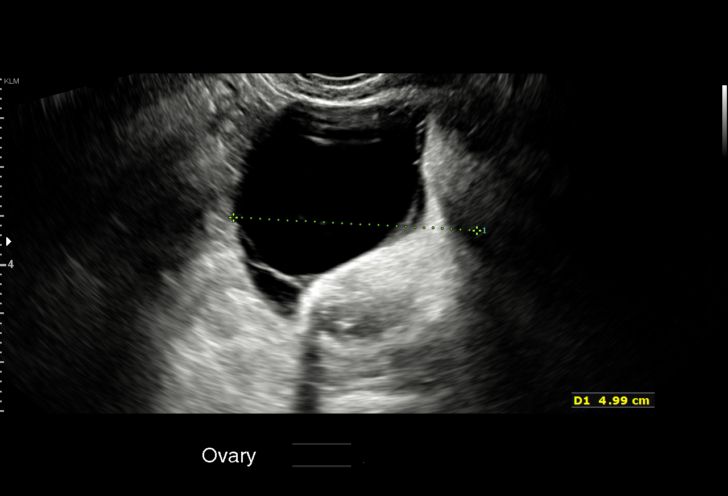
[im 29/35]
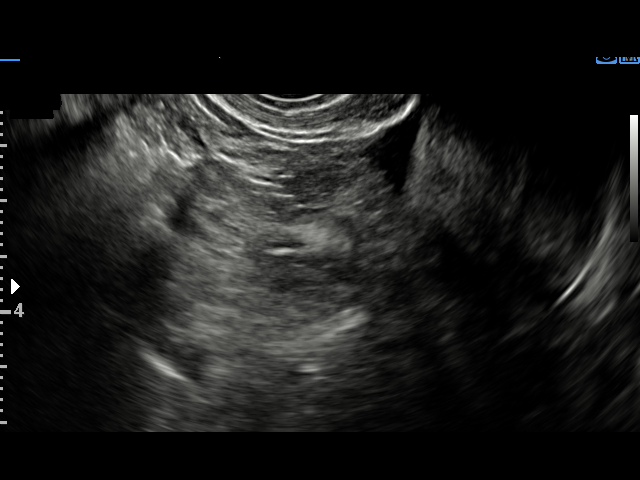
[im 32/35]
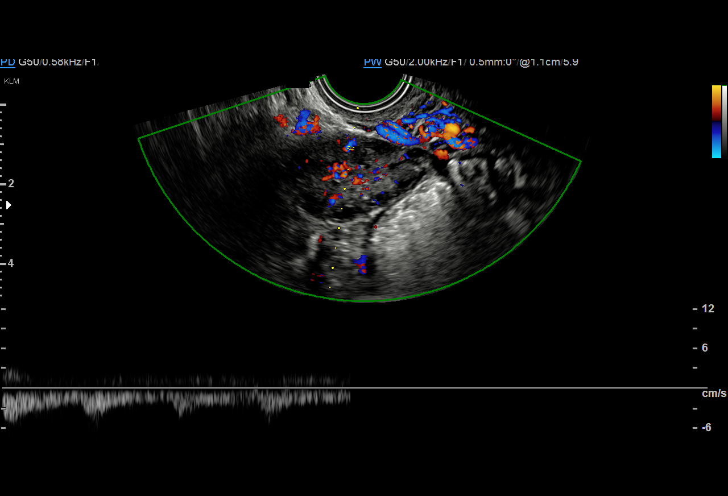
[im 35/35]
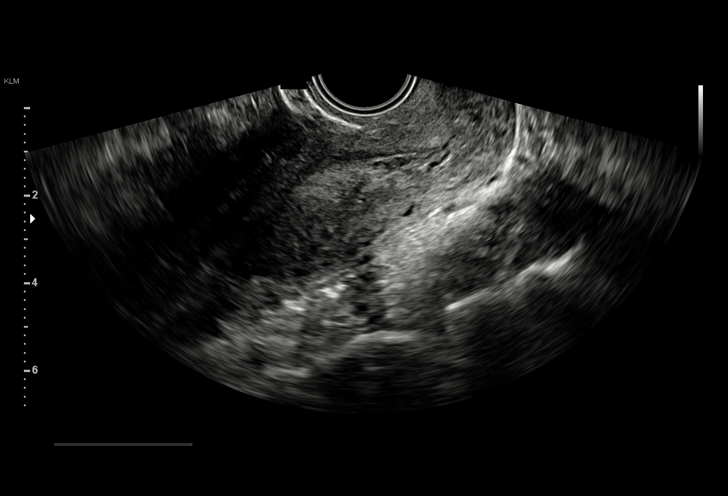

[15 of 28 positions shown; findings below may reference images not displayed]

FINDINGS: The uterus is anteverted appears unremarkable.

The endometrium measures approximately 7 mm in thickness. No
intrauterine pregnancy identified. The previously seen sac-like
structure within the endometrium is no longer present.

The right ovary is unremarkable and measures 3.7 x 2.5 x 2.9 cm.
Doppler images demonstrate flow to the right ovary.

The left ovary is not identified with certainty. There is a 6.9 x
4.9 x 6.9 cm complex mass with solid and multi-septated cystic
component in the region of the left ovary. This mass is not
characterized but may represent an ovarian lesion such as a dermoid.
An ectopic pregnancy is not excluded. Clinical correlation and
obstetrical consult is advised.

Arterial and venous flow noted within this mass.
IMPRESSION: 1. No intrauterine pregnancy identified. Findings likely represent
recent spontaneous abortion.
2. Complex mass in the region of the left adnexa as seen on the
prior ultrasound. Clinical correlation is recommended.

## 2022-02-13 IMAGING — US US OB COMP LESS 14 WK
1 series · 15 of 28 positions shown · non-contrast
Comparison: None.

CLINICAL DATA: Vaginal bleeding.

EXAM:
OBSTETRIC <14 WK US AND TRANSVAGINAL OB US
TECHNIQUE: Both transabdominal and transvaginal ultrasound examinations were
performed for complete evaluation of the gestation as well as the
maternal uterus, adnexal regions, and pelvic cul-de-sac.
Transvaginal technique was performed to assess early pregnancy.

[Series 1: us ob comp less 14 wk · 15 of 60 slices shown]
[im 1/60]
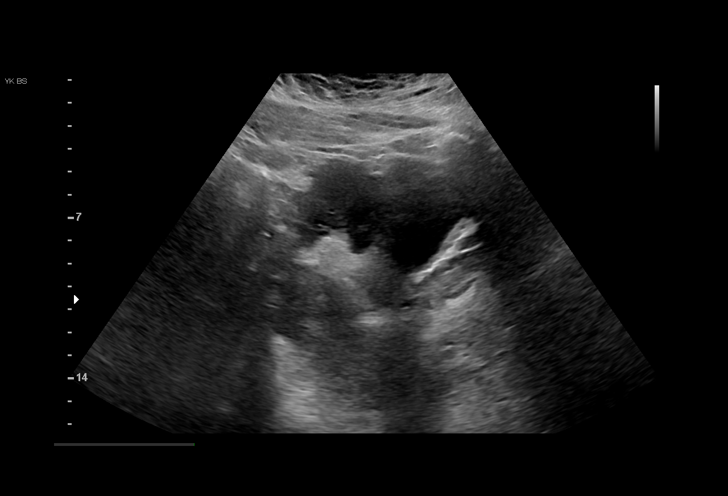
[im 5/60]
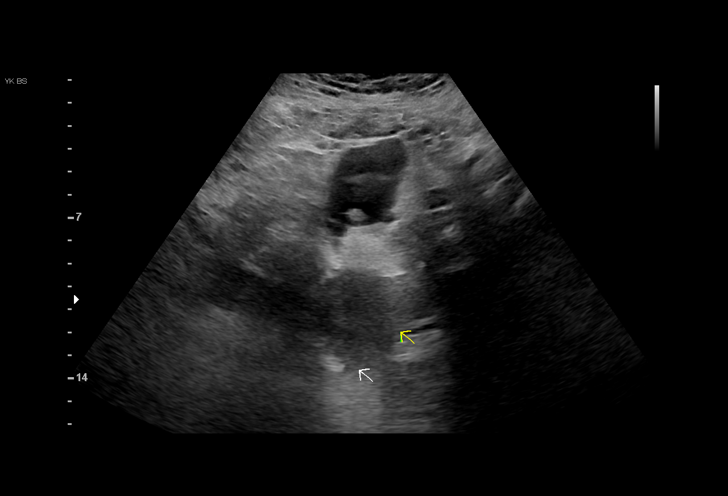
[im 9/60]
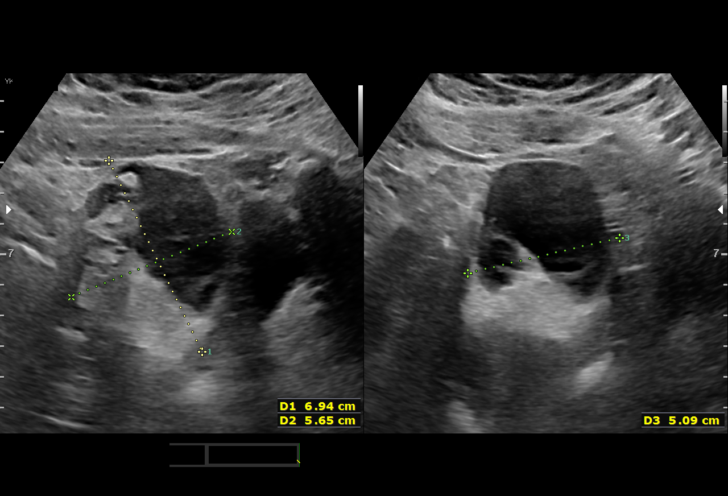
[im 14/60]
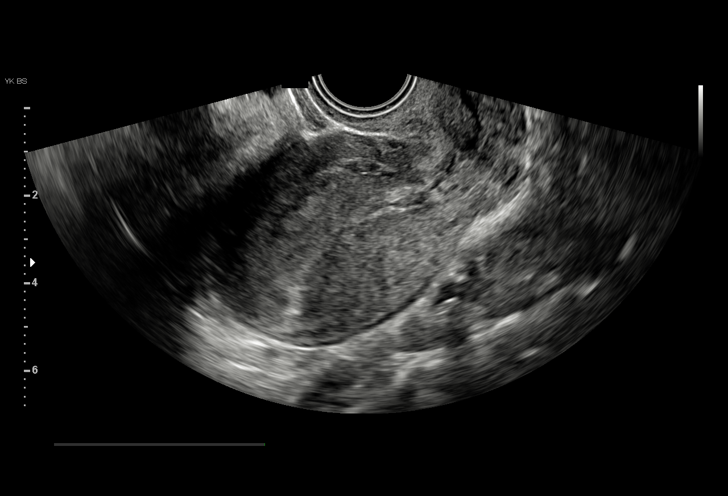
[im 18/60]
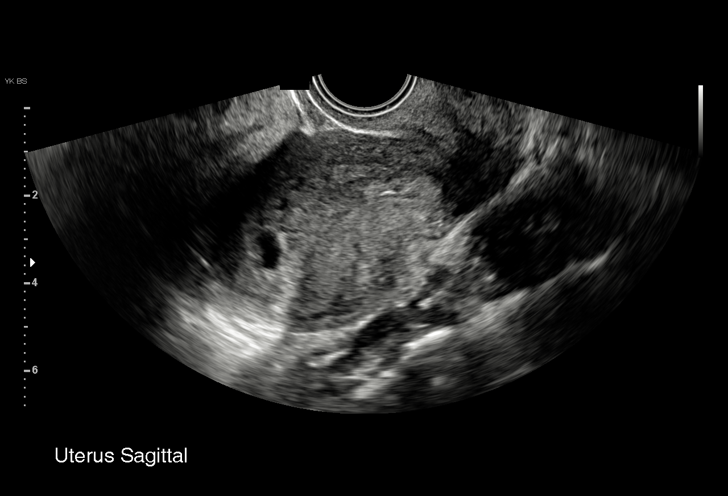
[im 22/60]
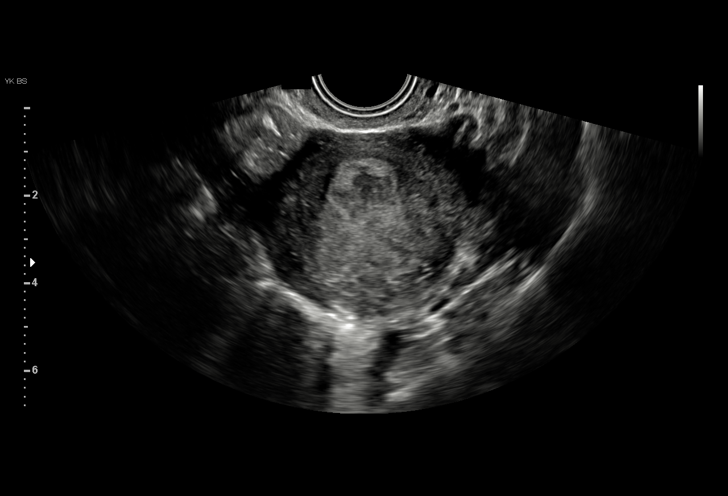
[im 27/60]
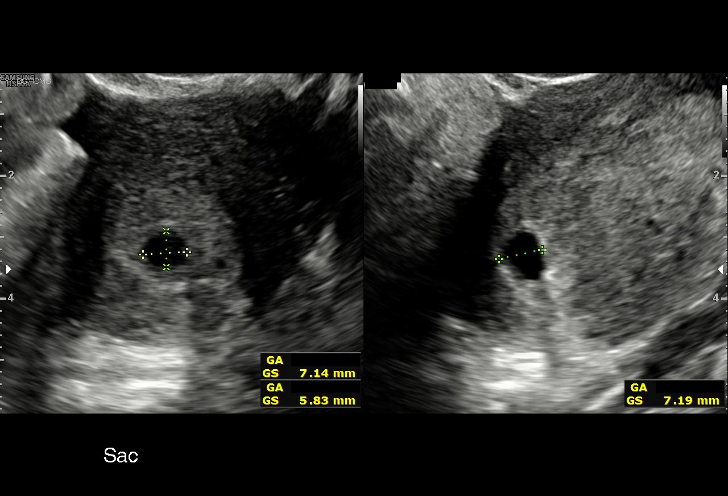
[im 31/60]
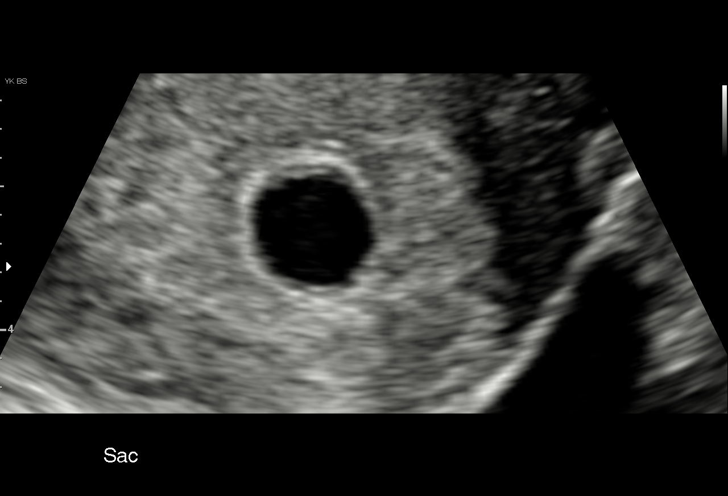
[im 33/60]
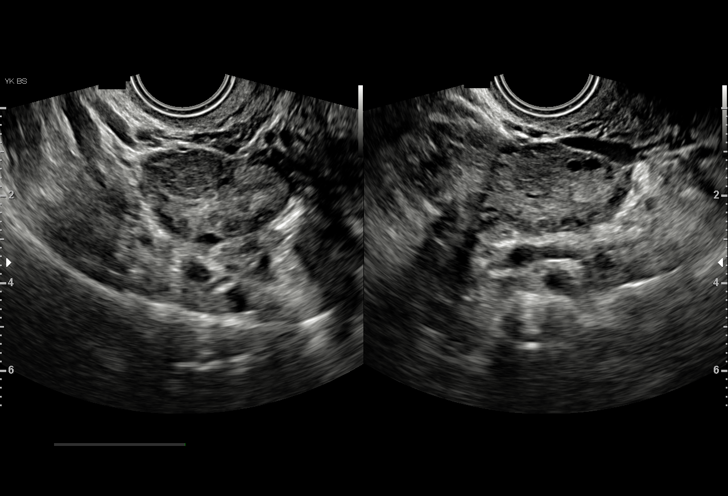
[im 38/60]
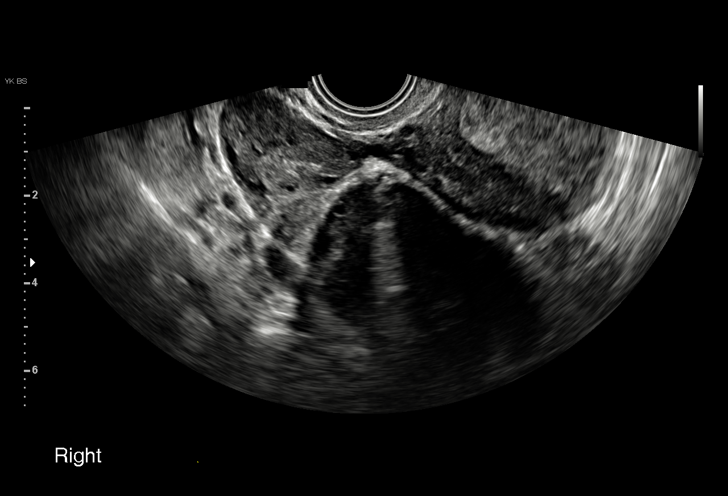
[im 42/60]
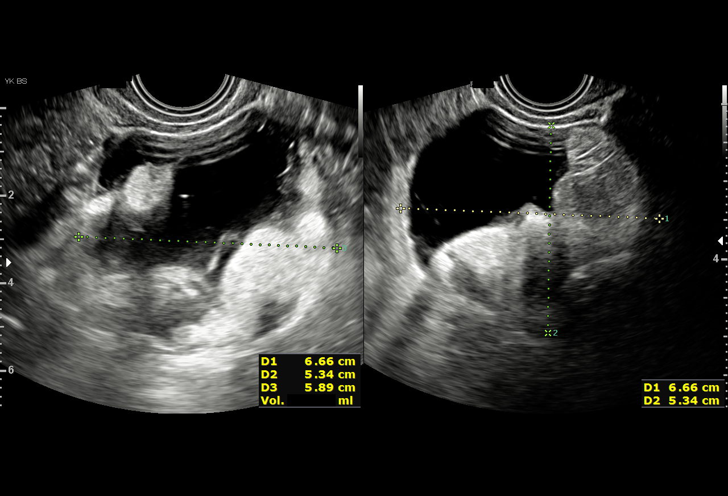
[im 46/60]
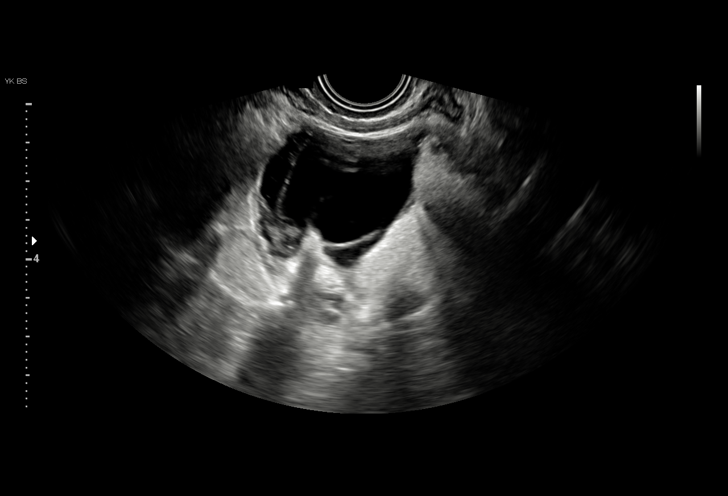
[im 51/60]
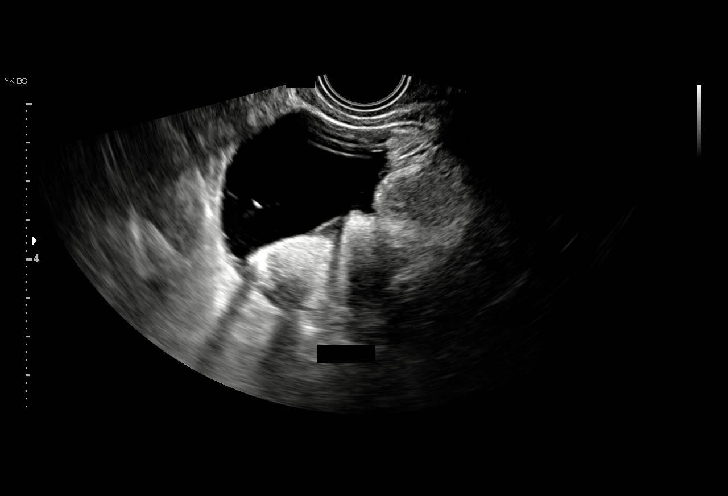
[im 55/60]
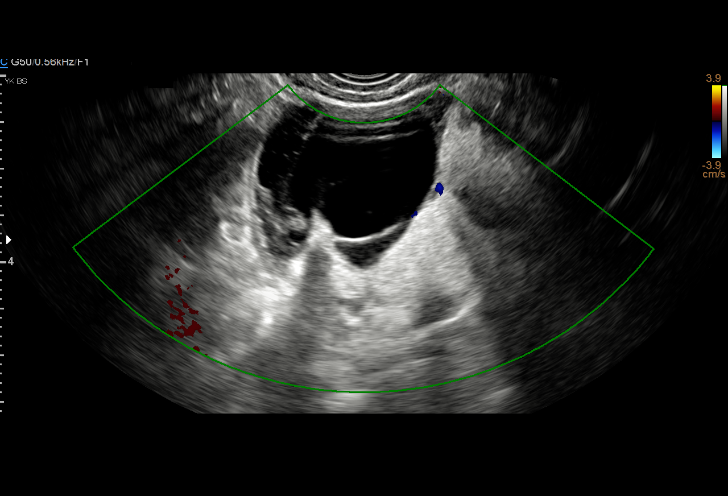
[im 60/60]
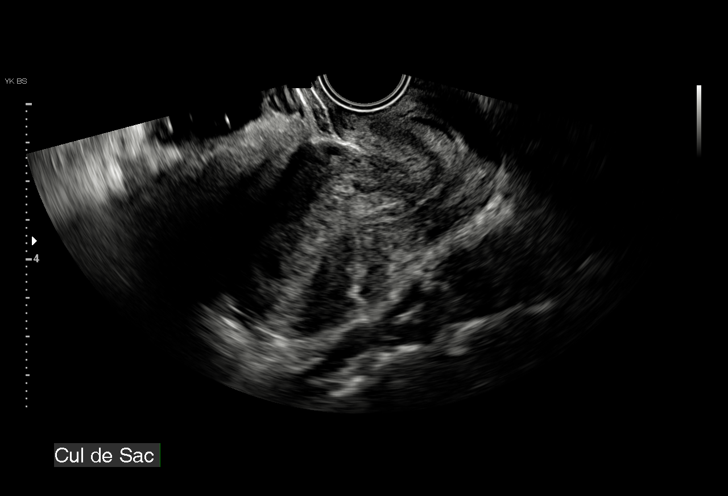

[15 of 28 positions shown; findings below may reference images not displayed]

FINDINGS: Intrauterine gestational sac: Probable early gestational sac.

Yolk sac:  Not Visualized.

Embryo:  Not Visualized.

MSD: 7.43 mm mm   5 w   3 d

Subchorionic hemorrhage:  None visualized.

Maternal uterus/adnexae: Normal right ovary. There is a mass in the
left adnexa which contains solid and cystic components measuring
x 5.7 x 6 cm. The mass is very heterogeneous in echotexture with
regions of shadowing identified.
IMPRESSION: 1. A rounded fluid collection in the endometrium is favored to
represent an early gestational sac. No yolk sac or fetal pole
identified. Probable early intrauterine gestational sac, but no yolk
sac, fetal pole, or cardiac activity yet visualized. Recommend
follow-up quantitative B-HCG levels and follow-up US in 14 days to
assess viability. This recommendation follows SRU consensus
guidelines: Diagnostic Criteria for Nonviable Pregnancy Early in the
First Trimester. N Engl J Med 0945; [DATE].
2. There is a heterogeneous solid and cystic mass in the left adnexa
measuring 7.4 x 5.7 x 6 cm. The mass likely arises within the left
ovary and a separate left ovary is not seen. This mass is favored to
represent a dermoid given the heterogeneous appearance with probable
fat and calcifications in the mass.

## 2022-07-23 LAB — OB RESULTS CONSOLE ANTIBODY SCREEN: Antibody Screen: NEGATIVE

## 2022-07-23 LAB — OB RESULTS CONSOLE RPR: RPR: NONREACTIVE

## 2022-07-23 LAB — OB RESULTS CONSOLE RUBELLA ANTIBODY, IGM: Rubella: NON-IMMUNE/NOT IMMUNE

## 2022-07-23 LAB — OB RESULTS CONSOLE GC/CHLAMYDIA
Chlamydia: NEGATIVE
Neisseria Gonorrhea: NEGATIVE

## 2022-07-23 LAB — OB RESULTS CONSOLE HEPATITIS B SURFACE ANTIGEN: Hepatitis B Surface Ag: NEGATIVE

## 2022-07-23 LAB — OB RESULTS CONSOLE HIV ANTIBODY (ROUTINE TESTING): HIV: NONREACTIVE

## 2022-07-23 LAB — HEPATITIS C ANTIBODY: HCV Ab: NEGATIVE

## 2022-08-05 ENCOUNTER — Other Ambulatory Visit: Payer: Self-pay | Admitting: Obstetrics and Gynecology

## 2022-08-05 DIAGNOSIS — E119 Type 2 diabetes mellitus without complications: Secondary | ICD-10-CM

## 2022-09-16 ENCOUNTER — Encounter: Payer: Self-pay | Admitting: *Deleted

## 2022-09-16 NOTE — Progress Notes (Signed)
Maternal Fetal Medicine Patient Intake Form Patient information  Name: Shirley Gutierrez  MRN: [id number]  Age: 22 y.o.  DOB: 2000-04-10  Date of MFM RN Intake: 09/22/22 Referring practice: MFM Referring Provider: [facility] ([facility])  Prenatal flow sheet   Date GA Fetal Movement Contractions Vaginal bleeding Loss of fluid Vital signs   09/22/22  [redacted]w[redacted]d                Problem List and Ultrasound Indications   Patient Active Problem List   Diagnosis Date Noted   Gestational diabetes 12/05/2020   Itching 10/12/2020   Pre-gravid BMI: 32  Dating method  LMP: Patient's last menstrual period was 05/13/2022.  Exact. Menstrual cycles: {Menstrual cycle:19197::"Regular every 28 days","Irregular","Uncertain-need to verify with patient"}. Earliest Ultrasound:  9 week 3 days gestation done on 07/23/22 . EDD: Estimated Date of Delivery: 02/17/23. Dating method:LMP.  Medical/Obstetric History    OB History  Gravida Para Term Preterm AB Living  3 1 1  0 1 1  SAB IAB Ectopic Multiple Live Births  1 0 0 0 1    # Outcome Date GA Lbr Len/2nd Weight Sex Delivery Anes PTL Lv  3 Current           2 Term 12/06/20 [redacted]w[redacted]d  8 lb 10.1 oz (3.915 kg) M CS-LTranv EPI  LIV  1 SAB 08/29/19 [redacted]w[redacted]d           Other Patient Concerns    Prenatal Labs and Vaccines  Check dates of all labs! If pertinent labs are not populated automatically they should be typed below.              LAB RESULTS   Genetics NIPS: Declined   AFP: Declined  Fetal sex:{Fetal sex:19197::"Female","Female","Does not want to know","Di-di twins: ***","mo-di twins: ***","mo-mo twins: ***"}          Carrier screening ([facility]): Declined   A1C/GTT Early GCT:     28w GCT: uncontrolled T2 DM    Three hour GTT: {MFMRN3HGTT:29320::"N/A"}  Blood Type    Antibody    Rubella    RPR    HBsAg    HIV    HCVAb    Other labs A1c: 10.5   Imaging (Date and Location)  Last growth: ______ % overall, _____ % AC  Dopplers: [ ]  n/a, [  ] normal [ ]  elevated, [ ]  absent [ ]  reversed Fetal echo: {Fetal echo:19197::"normal","abnormal, see report","n/a"} done on *** at {location:19197::"[facility]","[facility]","[facility]","needs scheduling"}  Allergies   Allergies  Allergen Reactions   Peanut-Containing Drug Products Other (See Comments)   Promethazine Itching, Nausea Only and Other (See Comments)    Drowsy. Drowsy. Drowsy. Drowsy. Drowsy.     Medications   Current Outpatient Medications on File Prior to Visit  Medication Sig Dispense Refill   cetirizine (ZYRTEC ALLERGY) 10 MG tablet Take 1 tablet (10 mg total) by mouth daily. 30 tablet 1   ibuprofen (ADVIL) 600 MG tablet Take 1 tablet (600 mg total) by mouth every 6 (six) hours. 30 tablet 0   ondansetron (ZOFRAN) 8 MG tablet Take 1 tablet (8 mg total) by mouth 2 (two) times daily. 30 tablet 0   oxyCODONE (OXY IR/ROXICODONE) 5 MG immediate release tablet Take 1 tablet (5 mg total) by mouth every 4 (four) hours as needed for severe pain. 10 tablet 0   Prenatal Vit-Fe Fumarate-FA (PRENATAL MULTIVITAMIN) TABS tablet Take 1 tablet by mouth daily at 12 noon.     No current facility-administered medications on  file prior to visit.

## 2022-09-22 ENCOUNTER — Ambulatory Visit: Payer: Self-pay | Admitting: *Deleted

## 2022-09-22 ENCOUNTER — Encounter: Payer: Self-pay | Admitting: *Deleted

## 2022-09-22 ENCOUNTER — Ambulatory Visit: Payer: Self-pay | Attending: Obstetrics and Gynecology

## 2022-09-22 ENCOUNTER — Other Ambulatory Visit: Payer: Self-pay | Admitting: *Deleted

## 2022-09-22 VITALS — BP 114/54 | HR 85

## 2022-09-22 DIAGNOSIS — O99210 Obesity complicating pregnancy, unspecified trimester: Secondary | ICD-10-CM

## 2022-09-22 DIAGNOSIS — O99212 Obesity complicating pregnancy, second trimester: Secondary | ICD-10-CM

## 2022-09-22 DIAGNOSIS — Z8279 Family history of other congenital malformations, deformations and chromosomal abnormalities: Secondary | ICD-10-CM | POA: Insufficient documentation

## 2022-09-22 DIAGNOSIS — O24119 Pre-existing diabetes mellitus, type 2, in pregnancy, unspecified trimester: Secondary | ICD-10-CM | POA: Insufficient documentation

## 2022-09-22 DIAGNOSIS — E119 Type 2 diabetes mellitus without complications: Secondary | ICD-10-CM | POA: Insufficient documentation

## 2022-09-22 DIAGNOSIS — R748 Abnormal levels of other serum enzymes: Secondary | ICD-10-CM | POA: Insufficient documentation

## 2022-09-22 DIAGNOSIS — O24112 Pre-existing diabetes mellitus, type 2, in pregnancy, second trimester: Secondary | ICD-10-CM

## 2022-09-22 DIAGNOSIS — E1169 Type 2 diabetes mellitus with other specified complication: Secondary | ICD-10-CM

## 2022-09-22 DIAGNOSIS — O24419 Gestational diabetes mellitus in pregnancy, unspecified control: Secondary | ICD-10-CM | POA: Insufficient documentation

## 2022-09-22 DIAGNOSIS — O09292 Supervision of pregnancy with other poor reproductive or obstetric history, second trimester: Secondary | ICD-10-CM

## 2022-09-22 DIAGNOSIS — Z3A18 18 weeks gestation of pregnancy: Secondary | ICD-10-CM

## 2022-09-22 DIAGNOSIS — O34219 Maternal care for unspecified type scar from previous cesarean delivery: Secondary | ICD-10-CM

## 2022-09-22 DIAGNOSIS — E669 Obesity, unspecified: Secondary | ICD-10-CM

## 2022-10-23 ENCOUNTER — Ambulatory Visit: Payer: Self-pay

## 2022-10-27 ENCOUNTER — Other Ambulatory Visit: Payer: Self-pay | Admitting: *Deleted

## 2022-10-27 ENCOUNTER — Ambulatory Visit: Payer: Self-pay | Attending: Maternal & Fetal Medicine

## 2022-10-27 DIAGNOSIS — O24119 Pre-existing diabetes mellitus, type 2, in pregnancy, unspecified trimester: Secondary | ICD-10-CM

## 2022-10-27 DIAGNOSIS — Z3A23 23 weeks gestation of pregnancy: Secondary | ICD-10-CM

## 2022-10-27 DIAGNOSIS — Z8774 Personal history of (corrected) congenital malformations of heart and circulatory system: Secondary | ICD-10-CM

## 2022-10-27 DIAGNOSIS — O99210 Obesity complicating pregnancy, unspecified trimester: Secondary | ICD-10-CM | POA: Insufficient documentation

## 2022-10-27 DIAGNOSIS — O09292 Supervision of pregnancy with other poor reproductive or obstetric history, second trimester: Secondary | ICD-10-CM

## 2022-10-27 DIAGNOSIS — E119 Type 2 diabetes mellitus without complications: Secondary | ICD-10-CM

## 2022-10-27 DIAGNOSIS — O99212 Obesity complicating pregnancy, second trimester: Secondary | ICD-10-CM

## 2022-10-27 DIAGNOSIS — O34219 Maternal care for unspecified type scar from previous cesarean delivery: Secondary | ICD-10-CM

## 2022-10-27 DIAGNOSIS — E669 Obesity, unspecified: Secondary | ICD-10-CM

## 2022-10-27 DIAGNOSIS — O24112 Pre-existing diabetes mellitus, type 2, in pregnancy, second trimester: Secondary | ICD-10-CM | POA: Insufficient documentation

## 2022-10-30 ENCOUNTER — Ambulatory Visit: Payer: Self-pay

## 2022-11-06 ENCOUNTER — Ambulatory Visit: Payer: Self-pay

## 2022-11-19 ENCOUNTER — Encounter: Payer: Self-pay | Admitting: *Deleted

## 2022-11-19 DIAGNOSIS — O24119 Pre-existing diabetes mellitus, type 2, in pregnancy, unspecified trimester: Secondary | ICD-10-CM | POA: Insufficient documentation

## 2022-11-24 ENCOUNTER — Encounter: Payer: Self-pay | Admitting: *Deleted

## 2022-11-24 ENCOUNTER — Ambulatory Visit: Payer: Self-pay | Admitting: *Deleted

## 2022-11-24 ENCOUNTER — Ambulatory Visit: Payer: Self-pay | Attending: Obstetrics and Gynecology

## 2022-11-24 DIAGNOSIS — E1169 Type 2 diabetes mellitus with other specified complication: Secondary | ICD-10-CM

## 2022-11-24 DIAGNOSIS — O24119 Pre-existing diabetes mellitus, type 2, in pregnancy, unspecified trimester: Secondary | ICD-10-CM | POA: Insufficient documentation

## 2022-11-24 DIAGNOSIS — Z3A27 27 weeks gestation of pregnancy: Secondary | ICD-10-CM

## 2022-11-24 DIAGNOSIS — O09292 Supervision of pregnancy with other poor reproductive or obstetric history, second trimester: Secondary | ICD-10-CM

## 2022-11-24 DIAGNOSIS — O99210 Obesity complicating pregnancy, unspecified trimester: Secondary | ICD-10-CM | POA: Insufficient documentation

## 2022-11-24 DIAGNOSIS — O34219 Maternal care for unspecified type scar from previous cesarean delivery: Secondary | ICD-10-CM

## 2022-11-24 DIAGNOSIS — O24112 Pre-existing diabetes mellitus, type 2, in pregnancy, second trimester: Secondary | ICD-10-CM

## 2022-11-24 DIAGNOSIS — E669 Obesity, unspecified: Secondary | ICD-10-CM

## 2022-11-25 ENCOUNTER — Other Ambulatory Visit: Payer: Self-pay | Admitting: *Deleted

## 2022-11-25 DIAGNOSIS — O24112 Pre-existing diabetes mellitus, type 2, in pregnancy, second trimester: Secondary | ICD-10-CM

## 2022-11-25 DIAGNOSIS — O99212 Obesity complicating pregnancy, second trimester: Secondary | ICD-10-CM

## 2022-11-25 DIAGNOSIS — O24113 Pre-existing diabetes mellitus, type 2, in pregnancy, third trimester: Secondary | ICD-10-CM

## 2022-11-25 DIAGNOSIS — O99213 Obesity complicating pregnancy, third trimester: Secondary | ICD-10-CM

## 2022-12-02 ENCOUNTER — Other Ambulatory Visit: Payer: Self-pay | Admitting: *Deleted

## 2022-12-02 DIAGNOSIS — O99212 Obesity complicating pregnancy, second trimester: Secondary | ICD-10-CM

## 2022-12-02 DIAGNOSIS — O24112 Pre-existing diabetes mellitus, type 2, in pregnancy, second trimester: Secondary | ICD-10-CM

## 2022-12-12 ENCOUNTER — Inpatient Hospital Stay (HOSPITAL_COMMUNITY)
Admission: AD | Admit: 2022-12-12 | Discharge: 2022-12-12 | Disposition: A | Discharge status: Home / Self Care | Payer: Self-pay | Attending: Obstetrics and Gynecology | Admitting: Obstetrics and Gynecology

## 2022-12-12 ENCOUNTER — Encounter (HOSPITAL_COMMUNITY): Payer: Self-pay | Admitting: Obstetrics and Gynecology

## 2022-12-12 DIAGNOSIS — Z794 Long term (current) use of insulin: Secondary | ICD-10-CM | POA: Insufficient documentation

## 2022-12-12 DIAGNOSIS — O24913 Unspecified diabetes mellitus in pregnancy, third trimester: Secondary | ICD-10-CM | POA: Insufficient documentation

## 2022-12-12 DIAGNOSIS — O24113 Pre-existing diabetes mellitus, type 2, in pregnancy, third trimester: Secondary | ICD-10-CM | POA: Insufficient documentation

## 2022-12-12 DIAGNOSIS — E1165 Type 2 diabetes mellitus with hyperglycemia: Secondary | ICD-10-CM | POA: Insufficient documentation

## 2022-12-12 DIAGNOSIS — Z3A30 30 weeks gestation of pregnancy: Secondary | ICD-10-CM | POA: Insufficient documentation

## 2022-12-12 DIAGNOSIS — N858 Other specified noninflammatory disorders of uterus: Secondary | ICD-10-CM

## 2022-12-12 DIAGNOSIS — O26893 Other specified pregnancy related conditions, third trimester: Secondary | ICD-10-CM | POA: Insufficient documentation

## 2022-12-12 LAB — URINALYSIS, ROUTINE W REFLEX MICROSCOPIC
Bacteria, UA: NONE SEEN
Bilirubin Urine: NEGATIVE
Glucose, UA: >=500 mg/dL — AB
Hgb urine dipstick: NEGATIVE
Ketones, ur: 5 mg/dL — AB
Leukocytes,Ua: NEGATIVE
Nitrite: NEGATIVE
Protein, ur: NEGATIVE mg/dL
Specific Gravity, Urine: 1.025 (ref 1.005–1.030)
pH: 6 (ref 5.0–8.0)

## 2022-12-12 LAB — WET PREP, GENITAL
Clue Cells Wet Prep HPF POC: NONE SEEN
Sperm: NONE SEEN
Trich, Wet Prep: NONE SEEN
WBC, Wet Prep HPF POC: <10 (ref <10)
Yeast Wet Prep HPF POC: NONE SEEN

## 2022-12-12 LAB — GLUCOSE, CAPILLARY
Glucose-Capillary: 197 mg/dL — ABNORMAL HIGH (ref 70–99)
Glucose-Capillary: 182 mg/dL — ABNORMAL HIGH (ref 70–99)
Glucose-Capillary: 125 mg/dL — ABNORMAL HIGH (ref 70–99)

## 2022-12-12 MED ORDER — LACTATED RINGERS IV BOLUS
1000.0000 mL | Freq: Once | INTRAVENOUS | Status: AC
  Administered 2022-12-12: 1000 mL via INTRAVENOUS

## 2022-12-12 MED ORDER — INSULIN GLARGINE 100 UNIT/ML ~~LOC~~ SOLN: 10.0000 [IU] | Freq: Every day | SUBCUTANEOUS | 0 refills | Status: DC

## 2022-12-12 MED ORDER — INSULIN ASPART 100 UNIT/ML IJ SOLN
4.0000 [IU] | Freq: Once | INTRAMUSCULAR | Status: AC
  Administered 2022-12-12: 4 [IU] via SUBCUTANEOUS

## 2022-12-12 MED ORDER — INSULIN ASPART 100 UNIT/ML IJ SOLN
6.0000 [IU] | Freq: Once | INTRAMUSCULAR | Status: AC
  Administered 2022-12-12: 6 [IU] via SUBCUTANEOUS

## 2022-12-12 MED ORDER — NIFEDIPINE 10 MG PO CAPS: 10.0000 mg | ORAL_CAPSULE | ORAL | 0 refills | Status: DC | PRN

## 2022-12-12 MED ORDER — INSULIN GLARGINE-YFGN 100 UNIT/ML ~~LOC~~ SOLN
10.0000 [IU] | Freq: Once | SUBCUTANEOUS | Status: AC
  Administered 2022-12-12: 10 [IU] via SUBCUTANEOUS
  Filled 2022-12-12: qty 0.1

## 2022-12-12 MED ORDER — NIFEDIPINE 10 MG PO CAPS
10.0000 mg | ORAL_CAPSULE | ORAL | Status: DC | PRN
  Administered 2022-12-12: 10 mg via ORAL
  Filled 2022-12-12: qty 1

## 2022-12-12 NOTE — MAU Note (Signed)
.  Shirley Gutierrez is a 22 y.o. at [redacted]w[redacted]d here in MAU reporting: has had non stop cramping since yesterday. Stated she has had some ctx. C/o increased pelvic pressure. Good fetal movement felt. Denied any vag bleeding or discharge. Pt stated she is diabetic and supposed to be taking insulin. Reports her insulin expired and has not taken it since last week. Reports her blood sugars have been higher than normal . LMP:  Onset of complaint: yesterday Pain score: 5-6 Vitals:   12/12/22 1151  BP: (!) 106/56  Pulse: (!) 101  Resp: 18  Temp: 98.4 F (36.9 C)     FHT:145 Lab orders placed from triage:  CBG, u/a

## 2022-12-12 NOTE — MAU Provider Note (Signed)
Chief Complaint: Contractions and Abdominal Pain  Provider contact time 1235    SUBJECTIVE HPI: Shirley Gutierrez is a 22 y.o. G3P1011 at [redacted]w[redacted]d by LMP who presents to maternity admissions reporting cramping.  Cramping started around noon yesterday. Intermittent. Some make her stop and have to pause. Mostly feel like mild period cramps. Denies LOF, VB. Increase in discharge but does not feel itchy or have odor. +FM. Denies HA, vision changes, RUQ pain, LEE.  Does have known T2DM. Usually on Lantus 10 U nightly. Took a vacation last week and insulin sat out in the heat. Has not used any since.  HPI  Past Medical History:  Diagnosis Date   Diabetes mellitus without complication (HCC)    Type 2   Headache    Heart murmur    when she was a child   S/P VSD repair    As a child   Past Surgical History:  Procedure Laterality Date   CESAREAN SECTION  12/06/2020   Procedure: CESAREAN SECTION;  Surgeon: Charlett Nose, MD;  Location: [facility];  Service: Obstetrics;;   HEART CHAMBER REVISION     closed hole in heart when seh was 2 months old   OVARIAN CYST REMOVAL     VSD REPAIR     as a child   Social History   Socioeconomic History   Marital status: Media planner    Spouse name: Not on file   Number of children: Not on file   Years of education: Not on file   Highest education level: Not on file  Occupational History   Not on file  Tobacco Use   Smoking status: Never   Smokeless tobacco: Never  Vaping Use   Vaping status: Never Used  Substance and Sexual Activity   Alcohol use: Never   Drug use: Never   Sexual activity: Yes  Other Topics Concern   Not on file  Social History Narrative   Not on file   Social Determinants of Health   Financial Resource Strain: Not on file  Food Insecurity: No Food Insecurity (09/22/2019)   Hunger Vital Sign    Worried About Running Out of Food in the Last Year: Never true    Ran Out of Food in the Last Year: Never true   Transportation Needs: No Transportation Needs (09/22/2019)   PRAPARE - Administrator, Civil Service (Medical): No    Lack of Transportation (Non-Medical): No  Physical Activity: Not on file  Stress: Not on file  Social Connections: Not on file  Intimate Partner Violence: Not on file   No current facility-administered medications on file prior to encounter.   Current Outpatient Medications on File Prior to Encounter  Medication Sig Dispense Refill   aspirin EC 81 MG tablet Take 81 mg by mouth daily. Swallow whole.     Prenatal Vit-Fe Fumarate-FA (PRENATAL MULTIVITAMIN) TABS tablet Take 1 tablet by mouth daily at 12 noon.     VITAMIN D PO Take by mouth.     Allergies  Allergen Reactions   Promethazine Itching, Nausea Only and Other (See Comments)    Drowsy. Drowsy. Drowsy. Drowsy. Drowsy.     ROS:  Pertinent positives/negatives listed above.  I have reviewed patient's Past Medical Hx, Surgical Hx, Family Hx, Social Hx, medications and allergies.   Physical Exam  Patient Vitals for the past 24 hrs:  BP Temp Pulse Resp Height Weight  12/12/22 1259 (!) 108/56 -- 91 -- -- --  12/12/22 1151 (!) 106/56 98.4 F (36.9 C) (!) 101 18 5\' 1"  (1.549 m) 82.1 kg   Constitutional: Well-developed, well-nourished female in no acute distress.  Cardiovascular: normal rate Respiratory: normal effort GI: Abd soft, non-tender MS: Extremities nontender, no edema, normal ROM Neurologic: Alert and oriented x 4.  GU: Neg CVAT.  Cervix 0/long/high  FHT:  Baseline 145, moderate variability, accelerations present, no decelerations Contractions: UI  LAB RESULTS Results for orders placed or performed during the hospital encounter of 12/12/22 (from the past 24 hour(s))  Urinalysis, Routine w reflex microscopic -Urine, Clean Catch     Status: Abnormal   Collection Time: 12/12/22 11:55 AM  Result Value Ref Range   Color, Urine YELLOW YELLOW   APPearance CLEAR CLEAR   Specific  Gravity, Urine 1.025 1.005 - 1.030   pH 6.0 5.0 - 8.0   Glucose, UA >=500 (A) NEGATIVE mg/dL   Hgb urine dipstick NEGATIVE NEGATIVE   Bilirubin Urine NEGATIVE NEGATIVE   Ketones, ur 5 (A) NEGATIVE mg/dL   Protein, ur NEGATIVE NEGATIVE mg/dL   Nitrite NEGATIVE NEGATIVE   Leukocytes,Ua NEGATIVE NEGATIVE   RBC / HPF 0-5 0 - 5 RBC/hpf   WBC, UA 0-5 0 - 5 WBC/hpf   Bacteria, UA NONE SEEN NONE SEEN   Squamous Epithelial / HPF 0-5 0 - 5 /HPF  Wet prep, genital     Status: None   Collection Time: 12/12/22 12:28 PM   Specimen: PATH Cytology Cervicovaginal Ancillary Only  Result Value Ref Range   Yeast Wet Prep HPF POC NONE SEEN NONE SEEN   Trich, Wet Prep NONE SEEN NONE SEEN   Clue Cells Wet Prep HPF POC NONE SEEN NONE SEEN   WBC, Wet Prep HPF POC <10 <10   Sperm NONE SEEN   Glucose, capillary     Status: Abnormal   Collection Time: 12/12/22 12:32 PM  Result Value Ref Range   Glucose-Capillary 197 (H) 70 - 99 mg/dL  Glucose, capillary     Status: Abnormal   Collection Time: 12/12/22  1:48 PM  Result Value Ref Range   Glucose-Capillary 182 (H) 70 - 99 mg/dL  Glucose, capillary     Status: Abnormal   Collection Time: 12/12/22  2:37 PM  Result Value Ref Range   Glucose-Capillary 125 (H) 70 - 99 mg/dL      IMAGING [place] MFM OB FOLLOW UP  Result Date: 11/24/2022 ----------------------------------------------------------------------  OBSTETRICS REPORT                       (Signed Final 11/24/2022 04:28 pm) ---------------------------------------------------------------------- Patient Info  ID #:       [id number]                          D.O.B.:  07-19-2000 (22 yrs)  Name:       Shirley Gutierrez           Visit Date: 11/24/2022 03:57 pm ---------------------------------------------------------------------- Performed By  Attending:        Ma Rings MD         Ref. Address:     [facility]  Obstetrics &                                                              Gynecology                                                             [address]                                                             [address]                                                             [place], [place]                                                             [address]  Performed By:     Dennis Bast RDMS      [facility]  Referred By:      Nigel Bridgeman  CNM ---------------------------------------------------------------------- Orders  #  Description                           Code        Ordered By  1  [place] MFM OB FOLLOW UP                   [id number]    Noralee Space ----------------------------------------------------------------------  #  Order #                     Accession #                Episode #  1  [id number]                   [id number]                 [id number] ---------------------------------------------------------------------- Indications  Fetal or maternal indication (Maternal VSD     O35.8XX0  [repaired])  Poor obstetric history: Previous gestational   O09.299  diabetes  Poor obstetric history: Previous               O09.299  preeclampsia / eclampsia/gestational HTN  Previous cesarean delivery, antepartum         O34.219  Pre-existing diabetes, type 2, in pregnancy,   O24.113  third trimester  Obesity complicating pregnancy, third          O99.213  trimester (BMI 33)  Antenatal follow-up for nonvisualized fetal    Z36.2  anatomy  [redacted] weeks gestation of pregnancy                Z3A.27 ---------------------------------------------------------------------- Vital Signs  BP:          129/66  ---------------------------------------------------------------------- Fetal Evaluation  Num Of Fetuses:         1  Fetal Heart Rate(bpm):  138  Cardiac Activity:       Observed  Presentation:           Breech  Placenta:               Fundal  Amniotic Fluid  AFI FV:      Within normal limits  AFI Sum(cm)     %Tile       Largest Pocket(cm)  15.63           56          4.7  RUQ(cm)       RLQ(cm)       LUQ(cm)        LLQ(cm)  4.4           4.7           2.76           3.77 ---------------------------------------------------------------------- Biometry  BPD:      67.6  mm     G. Age:  27w 2d         19  %    CI:        69.47   %    70 - 86                                                          FL/HC:      20.1   %  18.8 - 20.6  HC:      258.9  mm     G. Age:  28w 1d         28  %    HC/AC:      0.99        1.05 - 1.21  AC:       262   mm     G. Age:  30w 2d         96  %    FL/BPD:     76.9   %    71 - 87  FL:         52  mm     G. Age:  27w 5d         31  %    FL/AC:      19.8   %    20 - 24  LV:        5.4  mm  Est. FW:    1328  gm    2 lb 15 oz      82  % ---------------------------------------------------------------------- OB History  Blood Type:   O+  Maternal Racial/Ethnic Group:   Hispanic ---------------------------------------------------------------------- Gestational Age  LMP:           27w 6d        Date:  05/13/22                   EDD:   02/17/23  U/S Today:     28w 3d                                        EDD:   02/13/23  Best:          27w 6d     Det. By:  LMP  (05/13/22)          EDD:   02/17/23 ---------------------------------------------------------------------- Targeted Anatomy  Thorax  4 Chamber View:        Appears normal         Cardiac Activity:       Observed  Abdomen  Stomach:               Appears normal         Rt Kidney:              Appears normal  Lt Kidney:             Appears normal         Bladder:                Appears normal  Comment:     All anatomy previously cleared  ---------------------------------------------------------------------- Cervix Uterus Adnexa  Cervix  Not visualized (advanced GA >24wks)  Uterus  No abnormality visualized.  Right Ovary  Not visualized.  Left Ovary  Not visualized.  Cul De Sac  No free fluid seen.  Adnexa  No adnexal mass visualized ---------------------------------------------------------------------- Comments  This patient was seen for a follow up growth scan due to  pregestational diabetes treated with insulin.  She denies any  problems since her last exam.  She had a normal fetal echocardiogram performed with [facility]  pediatric cardiology.  She was informed that the fetal growth and amniotic fluid  level appears appropriate for her gestational age.  Due to pregestational diabetes, we will start weekly fetal  testing at  32 weeks.  She will return in 4 weeks for a growth scan and BPP. ----------------------------------------------------------------------                   Ma Rings, MD Electronically Signed Final Report   11/24/2022 04:28 pm ----------------------------------------------------------------------    MAU Management/MDM: Orders Placed This Encounter  Procedures   Wet prep, genital   Urinalysis, Routine w reflex microscopic -Urine, Clean Catch   Glucose, capillary   Glucose, capillary   Glucose, capillary   Discharge patient    Meds ordered this encounter  Medications   lactated ringers bolus 1,000 mL   NIFEdipine (PROCARDIA) capsule 10 mg   insulin glargine-yfgn (SEMGLEE) injection 10 Units   insulin aspart (novoLOG) injection 4 Units   insulin glargine (LANTUS) 100 UNIT/ML injection    Sig: Inject 0.1 mLs (10 Units total) into the skin at bedtime.    Dispense:  9 mL    Refill:  0   NIFEdipine (PROCARDIA) 10 MG capsule    Sig: Take 1 capsule (10 mg total) by mouth every 4 (four) hours as needed (contractions).    Dispense:  30 capsule    Refill:  0   insulin aspart (novoLOG) injection 6 Units    Patient  presented with preterm contractions. Given closed cervix and UI on toco, does not show signs of these causing preterm labor. Will evaluate for causes of UI, including yeast/BV, GC/C, UTI, dehydration. Will treat with 1 L LR bolus, procardia series.  Will give Lantus 10 U along with novolog 4 U for CBG 197. Additionally prescribed her home regimen, so she has a new prescription.  1400: Rpt CBG 182. Feeling better from cramping standpoint after procardia x1. Given additional novolog 6 U.  1442: CBG 125 and feeling well. Discussed new prescription of Lantus and to take dose tomorrow. Additionally provided with procardia for any further uterine irritability. Patient expressed verbal understanding of plan and return precautions.  ASSESSMENT 1. Uterine irritability   2. Type 2 diabetes mellitus with hyperglycemia, with long-term current use of insulin (HCC)   3. Pregnancy complicated by pre-existing type 2 diabetes in third trimester   4. [redacted] weeks gestation of pregnancy     PLAN Discharge home with strict return precautions. Allergies as of 12/12/2022       Reactions   Promethazine Itching, Nausea Only, Other (See Comments)   Drowsy. Drowsy. Drowsy. Drowsy. Drowsy.        Medication List     TAKE these medications    aspirin EC 81 MG tablet Take 81 mg by mouth daily. Swallow whole.   insulin glargine 100 UNIT/ML injection Commonly known as: LANTUS Inject 0.1 mLs (10 Units total) into the skin at bedtime.   NIFEdipine 10 MG capsule Commonly known as: PROCARDIA Take 1 capsule (10 mg total) by mouth every 4 (four) hours as needed (contractions).   prenatal multivitamin Tabs tablet Take 1 tablet by mouth daily at 12 noon.   VITAMIN D PO Take by mouth.        Wylene Simmer, MD OB Fellow 12/12/2022  2:43 PM

## 2022-12-15 LAB — GC/CHLAMYDIA PROBE AMP (~~LOC~~) NOT AT ARMC
Chlamydia: NEGATIVE
Comment: NEGATIVE
Comment: NORMAL
Neisseria Gonorrhea: NEGATIVE

## 2022-12-20 ENCOUNTER — Other Ambulatory Visit: Payer: Self-pay | Admitting: Obstetrics and Gynecology

## 2022-12-22 ENCOUNTER — Encounter: Payer: Self-pay | Admitting: *Deleted

## 2022-12-22 ENCOUNTER — Ambulatory Visit: Payer: Self-pay | Admitting: *Deleted

## 2022-12-22 ENCOUNTER — Ambulatory Visit: Payer: Self-pay | Attending: Obstetrics

## 2022-12-22 DIAGNOSIS — E669 Obesity, unspecified: Secondary | ICD-10-CM

## 2022-12-22 DIAGNOSIS — Z3A31 31 weeks gestation of pregnancy: Secondary | ICD-10-CM

## 2022-12-22 DIAGNOSIS — Z8774 Personal history of (corrected) congenital malformations of heart and circulatory system: Secondary | ICD-10-CM

## 2022-12-22 DIAGNOSIS — O24112 Pre-existing diabetes mellitus, type 2, in pregnancy, second trimester: Secondary | ICD-10-CM

## 2022-12-22 DIAGNOSIS — O24113 Pre-existing diabetes mellitus, type 2, in pregnancy, third trimester: Secondary | ICD-10-CM

## 2022-12-22 DIAGNOSIS — O99212 Obesity complicating pregnancy, second trimester: Secondary | ICD-10-CM

## 2022-12-22 DIAGNOSIS — O09293 Supervision of pregnancy with other poor reproductive or obstetric history, third trimester: Secondary | ICD-10-CM

## 2022-12-22 DIAGNOSIS — E119 Type 2 diabetes mellitus without complications: Secondary | ICD-10-CM

## 2022-12-22 DIAGNOSIS — O34219 Maternal care for unspecified type scar from previous cesarean delivery: Secondary | ICD-10-CM

## 2022-12-22 DIAGNOSIS — O99213 Obesity complicating pregnancy, third trimester: Secondary | ICD-10-CM

## 2022-12-22 DIAGNOSIS — Z794 Long term (current) use of insulin: Secondary | ICD-10-CM

## 2022-12-30 ENCOUNTER — Ambulatory Visit: Payer: Self-pay | Admitting: *Deleted

## 2022-12-30 ENCOUNTER — Other Ambulatory Visit: Payer: Self-pay

## 2022-12-30 ENCOUNTER — Ambulatory Visit: Payer: Self-pay | Attending: Obstetrics

## 2022-12-30 VITALS — BP 120/57 | HR 90

## 2022-12-30 DIAGNOSIS — O24113 Pre-existing diabetes mellitus, type 2, in pregnancy, third trimester: Secondary | ICD-10-CM

## 2022-12-30 DIAGNOSIS — E119 Type 2 diabetes mellitus without complications: Secondary | ICD-10-CM

## 2022-12-30 DIAGNOSIS — Z3A33 33 weeks gestation of pregnancy: Secondary | ICD-10-CM

## 2022-12-30 DIAGNOSIS — O09293 Supervision of pregnancy with other poor reproductive or obstetric history, third trimester: Secondary | ICD-10-CM

## 2022-12-30 DIAGNOSIS — E669 Obesity, unspecified: Secondary | ICD-10-CM

## 2022-12-30 DIAGNOSIS — O99212 Obesity complicating pregnancy, second trimester: Secondary | ICD-10-CM | POA: Insufficient documentation

## 2022-12-30 DIAGNOSIS — O34219 Maternal care for unspecified type scar from previous cesarean delivery: Secondary | ICD-10-CM

## 2022-12-30 DIAGNOSIS — Z794 Long term (current) use of insulin: Secondary | ICD-10-CM

## 2022-12-30 DIAGNOSIS — O24112 Pre-existing diabetes mellitus, type 2, in pregnancy, second trimester: Secondary | ICD-10-CM | POA: Insufficient documentation

## 2022-12-30 DIAGNOSIS — Z8774 Personal history of (corrected) congenital malformations of heart and circulatory system: Secondary | ICD-10-CM

## 2022-12-30 DIAGNOSIS — O99213 Obesity complicating pregnancy, third trimester: Secondary | ICD-10-CM

## 2023-01-03 ENCOUNTER — Other Ambulatory Visit: Payer: Self-pay | Admitting: Obstetrics and Gynecology

## 2023-01-05 ENCOUNTER — Other Ambulatory Visit: Payer: Self-pay | Admitting: Obstetrics and Gynecology

## 2023-01-05 ENCOUNTER — Ambulatory Visit: Payer: Self-pay

## 2023-01-06 ENCOUNTER — Ambulatory Visit: Payer: Self-pay | Attending: Obstetrics | Admitting: *Deleted

## 2023-01-06 ENCOUNTER — Other Ambulatory Visit: Payer: Self-pay | Admitting: *Deleted

## 2023-01-06 ENCOUNTER — Ambulatory Visit (HOSPITAL_BASED_OUTPATIENT_CLINIC_OR_DEPARTMENT_OTHER): Payer: Self-pay

## 2023-01-06 ENCOUNTER — Other Ambulatory Visit: Payer: Self-pay

## 2023-01-06 VITALS — BP 121/68 | HR 83

## 2023-01-06 DIAGNOSIS — O24113 Pre-existing diabetes mellitus, type 2, in pregnancy, third trimester: Secondary | ICD-10-CM

## 2023-01-06 DIAGNOSIS — O99212 Obesity complicating pregnancy, second trimester: Secondary | ICD-10-CM

## 2023-01-06 DIAGNOSIS — O99213 Obesity complicating pregnancy, third trimester: Secondary | ICD-10-CM

## 2023-01-06 DIAGNOSIS — Z794 Long term (current) use of insulin: Secondary | ICD-10-CM

## 2023-01-06 DIAGNOSIS — O34219 Maternal care for unspecified type scar from previous cesarean delivery: Secondary | ICD-10-CM

## 2023-01-06 DIAGNOSIS — O09292 Supervision of pregnancy with other poor reproductive or obstetric history, second trimester: Secondary | ICD-10-CM

## 2023-01-06 DIAGNOSIS — O24414 Gestational diabetes mellitus in pregnancy, insulin controlled: Secondary | ICD-10-CM

## 2023-01-06 DIAGNOSIS — O24112 Pre-existing diabetes mellitus, type 2, in pregnancy, second trimester: Secondary | ICD-10-CM

## 2023-01-06 DIAGNOSIS — Z3A34 34 weeks gestation of pregnancy: Secondary | ICD-10-CM | POA: Insufficient documentation

## 2023-01-06 DIAGNOSIS — E669 Obesity, unspecified: Secondary | ICD-10-CM

## 2023-01-06 DIAGNOSIS — O3663X0 Maternal care for excessive fetal growth, third trimester, not applicable or unspecified: Secondary | ICD-10-CM | POA: Insufficient documentation

## 2023-01-12 ENCOUNTER — Ambulatory Visit: Payer: Self-pay

## 2023-01-12 ENCOUNTER — Ambulatory Visit: Payer: Self-pay | Attending: Obstetrics

## 2023-01-15 ENCOUNTER — Other Ambulatory Visit: Payer: Self-pay | Admitting: Obstetrics and Gynecology

## 2023-01-20 ENCOUNTER — Telehealth (HOSPITAL_COMMUNITY): Payer: Self-pay | Admitting: *Deleted

## 2023-01-20 ENCOUNTER — Encounter (HOSPITAL_COMMUNITY): Payer: Self-pay

## 2023-01-20 NOTE — Telephone Encounter (Signed)
Preadmission screen  

## 2023-01-20 NOTE — Patient Instructions (Signed)
Shirley Gutierrez  01/20/2023   Your procedure is scheduled on:  01/26/2023  Arrive at 1015 at Entrance C on [address] at [facility]  and [employer]. You are invited to use the FREE valet parking or use the Visitor's parking deck.  Pick up the phone at the desk and dial [phone].  Call this number if you have problems the morning of surgery: [phone]  Remember:   Do not eat food:(After Midnight) Desps de medianoche.  You may drink clear liquids until arrival at __1015___.  Clear liquids means a liquid you can see thru.  It can have color such as Cola or Kool aid.  Tea is OK and coffee as long as no milk or creamer of any kind..  Take these medicines the morning of surgery with A SIP OF WATER:  Take half of the prescribed dose of insulin the night before surgery.  No medication on day of surgery   Do not wear jewelry, make-up or nail polish.  Do not wear lotions, powders, or perfumes. Do not wear deodorant.  Do not shave 48 hours prior to surgery.  Do not bring valuables to the hospital.  [facility] is not   responsible for any belongings or valuables brought to the hospital.  Contacts, dentures or bridgework may not be worn into surgery.  Leave suitcase in the car. After surgery it may be brought to your room.  For patients admitted to the hospital, checkout time is 11:00 AM the day of              discharge.      Please read over the following fact sheets that you were given:     Preparing for Surgery

## 2023-01-21 ENCOUNTER — Ambulatory Visit: Payer: Self-pay | Admitting: *Deleted

## 2023-01-21 ENCOUNTER — Encounter (HOSPITAL_COMMUNITY): Payer: Self-pay

## 2023-01-21 ENCOUNTER — Ambulatory Visit: Payer: Self-pay | Attending: Obstetrics

## 2023-01-21 ENCOUNTER — Other Ambulatory Visit: Payer: Self-pay

## 2023-01-21 VITALS — BP 124/52 | HR 64

## 2023-01-21 DIAGNOSIS — E669 Obesity, unspecified: Secondary | ICD-10-CM

## 2023-01-21 DIAGNOSIS — O99213 Obesity complicating pregnancy, third trimester: Secondary | ICD-10-CM | POA: Insufficient documentation

## 2023-01-21 DIAGNOSIS — O24113 Pre-existing diabetes mellitus, type 2, in pregnancy, third trimester: Secondary | ICD-10-CM | POA: Insufficient documentation

## 2023-01-21 DIAGNOSIS — O34219 Maternal care for unspecified type scar from previous cesarean delivery: Secondary | ICD-10-CM

## 2023-01-21 DIAGNOSIS — Z3A36 36 weeks gestation of pregnancy: Secondary | ICD-10-CM

## 2023-01-21 DIAGNOSIS — E1169 Type 2 diabetes mellitus with other specified complication: Secondary | ICD-10-CM

## 2023-01-21 DIAGNOSIS — O09293 Supervision of pregnancy with other poor reproductive or obstetric history, third trimester: Secondary | ICD-10-CM

## 2023-01-21 DIAGNOSIS — O3663X0 Maternal care for excessive fetal growth, third trimester, not applicable or unspecified: Secondary | ICD-10-CM

## 2023-01-23 ENCOUNTER — Encounter (HOSPITAL_COMMUNITY)
Admission: RE | Admit: 2023-01-23 | Discharge: 2023-01-23 | Disposition: A | Discharge status: Home / Self Care | Payer: Self-pay | Source: Ambulatory Visit | Attending: Obstetrics and Gynecology | Admitting: Obstetrics and Gynecology

## 2023-01-23 VITALS — Ht 61.0 in | Wt 187.0 lb

## 2023-01-23 DIAGNOSIS — Z01812 Encounter for preprocedural laboratory examination: Secondary | ICD-10-CM | POA: Insufficient documentation

## 2023-01-23 DIAGNOSIS — O24113 Pre-existing diabetes mellitus, type 2, in pregnancy, third trimester: Secondary | ICD-10-CM | POA: Insufficient documentation

## 2023-01-23 LAB — CBC
HCT: 31.7 % — ABNORMAL LOW (ref 36.0–46.0)
Hemoglobin: 9.8 g/dL — ABNORMAL LOW (ref 12.0–15.0)
MCH: 24 pg — ABNORMAL LOW (ref 26.0–34.0)
MCHC: 30.9 g/dL (ref 30.0–36.0)
MCV: 77.5 fL — ABNORMAL LOW (ref 80.0–100.0)
Platelets: 225 10*3/uL (ref 150–400)
RBC: 4.09 MIL/uL (ref 3.87–5.11)
RDW: 14.3 % (ref 11.5–15.5)
WBC: 5.5 10*3/uL (ref 4.0–10.5)
nRBC: 0 % (ref 0.0–0.2)

## 2023-01-23 LAB — TYPE AND SCREEN
ABO/RH(D): O POS
Antibody Screen: NEGATIVE

## 2023-01-23 LAB — RPR: RPR Ser Ql: NONREACTIVE

## 2023-01-25 ENCOUNTER — Encounter (HOSPITAL_COMMUNITY): Payer: Self-pay | Admitting: Obstetrics and Gynecology

## 2023-01-26 ENCOUNTER — Inpatient Hospital Stay (HOSPITAL_COMMUNITY): Payer: Self-pay

## 2023-01-26 ENCOUNTER — Other Ambulatory Visit: Payer: Self-pay

## 2023-01-26 ENCOUNTER — Inpatient Hospital Stay (HOSPITAL_COMMUNITY)
Admission: AD | Admit: 2023-01-26 | Discharge: 2023-01-29 | DRG: 786 | Disposition: A | Discharge status: Home / Self Care | Payer: Self-pay | Attending: Obstetrics and Gynecology | Admitting: Obstetrics and Gynecology

## 2023-01-26 ENCOUNTER — Encounter (HOSPITAL_COMMUNITY): Payer: Self-pay | Admitting: Obstetrics and Gynecology

## 2023-01-26 ENCOUNTER — Encounter (HOSPITAL_COMMUNITY)
Admission: AD | Disposition: A | Discharge status: Home / Self Care | Payer: Self-pay | Source: Home / Self Care | Attending: Obstetrics and Gynecology

## 2023-01-26 DIAGNOSIS — Z9889 Other specified postprocedural states: Secondary | ICD-10-CM

## 2023-01-26 DIAGNOSIS — O3663X0 Maternal care for excessive fetal growth, third trimester, not applicable or unspecified: Secondary | ICD-10-CM | POA: Diagnosis present

## 2023-01-26 DIAGNOSIS — Z3A37 37 weeks gestation of pregnancy: Secondary | ICD-10-CM

## 2023-01-26 DIAGNOSIS — O1404 Mild to moderate pre-eclampsia, complicating childbirth: Secondary | ICD-10-CM | POA: Diagnosis present

## 2023-01-26 DIAGNOSIS — Z98891 History of uterine scar from previous surgery: Principal | ICD-10-CM

## 2023-01-26 DIAGNOSIS — O9902 Anemia complicating childbirth: Secondary | ICD-10-CM | POA: Diagnosis present

## 2023-01-26 DIAGNOSIS — O2412 Pre-existing diabetes mellitus, type 2, in childbirth: Secondary | ICD-10-CM | POA: Diagnosis present

## 2023-01-26 DIAGNOSIS — D509 Iron deficiency anemia, unspecified: Secondary | ICD-10-CM | POA: Diagnosis present

## 2023-01-26 DIAGNOSIS — Z833 Family history of diabetes mellitus: Secondary | ICD-10-CM

## 2023-01-26 DIAGNOSIS — O99214 Obesity complicating childbirth: Secondary | ICD-10-CM | POA: Diagnosis present

## 2023-01-26 DIAGNOSIS — Z3A36 36 weeks gestation of pregnancy: Secondary | ICD-10-CM

## 2023-01-26 DIAGNOSIS — O34211 Maternal care for low transverse scar from previous cesarean delivery: Principal | ICD-10-CM | POA: Diagnosis present

## 2023-01-26 DIAGNOSIS — E66813 Obesity, class 3: Secondary | ICD-10-CM | POA: Diagnosis present

## 2023-01-26 DIAGNOSIS — E1165 Type 2 diabetes mellitus with hyperglycemia: Secondary | ICD-10-CM | POA: Diagnosis present

## 2023-01-26 LAB — GLUCOSE, CAPILLARY
Glucose-Capillary: 95 mg/dL (ref 70–99)
Glucose-Capillary: 146 mg/dL — ABNORMAL HIGH (ref 70–99)

## 2023-01-26 LAB — CBC
HCT: 31.1 % — ABNORMAL LOW (ref 36.0–46.0)
Hemoglobin: 9.9 g/dL — ABNORMAL LOW (ref 12.0–15.0)
MCH: 24.2 pg — ABNORMAL LOW (ref 26.0–34.0)
MCHC: 31.8 g/dL (ref 30.0–36.0)
MCV: 76 fL — ABNORMAL LOW (ref 80.0–100.0)
Platelets: 220 10*3/uL (ref 150–400)
RBC: 4.09 MIL/uL (ref 3.87–5.11)
RDW: 14.6 % (ref 11.5–15.5)
WBC: 11.2 10*3/uL — ABNORMAL HIGH (ref 4.0–10.5)
nRBC: 0 % (ref 0.0–0.2)

## 2023-01-26 LAB — COMPREHENSIVE METABOLIC PANEL
ALT: 18 U/L (ref 0–44)
AST: 38 U/L (ref 15–41)
Albumin: 2.1 g/dL — ABNORMAL LOW (ref 3.5–5.0)
Alkaline Phosphatase: 149 U/L — ABNORMAL HIGH (ref 38–126)
Anion gap: 8 (ref 5–15)
BUN: <5 mg/dL — ABNORMAL LOW (ref 6–20)
CO2: 19 mmol/L — ABNORMAL LOW (ref 22–32)
Calcium: 8.3 mg/dL — ABNORMAL LOW (ref 8.9–10.3)
Chloride: 107 mmol/L (ref 98–111)
Creatinine, Ser: 0.51 mg/dL (ref 0.44–1.00)
GFR, Estimated: >60 mL/min (ref >60)
Glucose, Bld: 135 mg/dL — ABNORMAL HIGH (ref 70–99)
Potassium: 3.6 mmol/L (ref 3.5–5.1)
Sodium: 134 mmol/L — ABNORMAL LOW (ref 135–145)
Total Bilirubin: 0.4 mg/dL (ref <1.2)
Total Protein: 5.5 g/dL — ABNORMAL LOW (ref 6.5–8.1)

## 2023-01-26 LAB — PROTEIN / CREATININE RATIO, URINE
Creatinine, Urine: 25 mg/dL
Protein Creatinine Ratio: 0.32 mg/mg{creat} — ABNORMAL HIGH (ref 0.00–0.15)
Total Protein, Urine: 8 mg/dL

## 2023-01-26 LAB — GROUP B STREP BY PCR: Group B strep by PCR: POSITIVE — AB

## 2023-01-26 LAB — TYPE AND SCREEN
ABO/RH(D): O POS
Antibody Screen: NEGATIVE

## 2023-01-26 SURGERY — Surgical Case
Anesthesia: Spinal | Site: Abdomen

## 2023-01-26 MED ORDER — ONDANSETRON HCL 4 MG/2ML IJ SOLN
INTRAMUSCULAR | Status: AC
  Filled 2023-01-26: qty 2

## 2023-01-26 MED ORDER — SODIUM CHLORIDE 0.9% FLUSH: 3.0000 mL | INTRAVENOUS | Status: DC | PRN

## 2023-01-26 MED ORDER — DEXMEDETOMIDINE HCL IN NACL 80 MCG/20ML IV SOLN
INTRAVENOUS | Status: DC | PRN
  Administered 2023-01-26: 8 ug via INTRAVENOUS

## 2023-01-26 MED ORDER — DIPHENHYDRAMINE HCL 25 MG PO CAPS: 25.0000 mg | ORAL_CAPSULE | Freq: Four times a day (QID) | ORAL | Status: DC | PRN

## 2023-01-26 MED ORDER — MORPHINE SULFATE (PF) 0.5 MG/ML IJ SOLN
INTRAMUSCULAR | Status: AC
  Filled 2023-01-26: qty 10

## 2023-01-26 MED ORDER — FENTANYL CITRATE (PF) 100 MCG/2ML IJ SOLN
INTRAMUSCULAR | Status: AC
  Filled 2023-01-26: qty 2

## 2023-01-26 MED ORDER — SOD CITRATE-CITRIC ACID 500-334 MG/5ML PO SOLN: 30.0000 mL | ORAL | Status: DC

## 2023-01-26 MED ORDER — TRANEXAMIC ACID-NACL 1000-0.7 MG/100ML-% IV SOLN
INTRAVENOUS | Status: AC
Start: 2023-01-26 — End: ?
  Filled 2023-01-26: qty 100

## 2023-01-26 MED ORDER — SODIUM CHLORIDE 0.9 % IR SOLN
Status: DC | PRN
  Administered 2023-01-26: 1000 mL

## 2023-01-26 MED ORDER — DEXAMETHASONE SODIUM PHOSPHATE 10 MG/ML IJ SOLN
INTRAMUSCULAR | Status: DC | PRN
  Administered 2023-01-26: 10 mg via INTRAVENOUS

## 2023-01-26 MED ORDER — ACETAMINOPHEN 500 MG PO TABS: 1000.0000 mg | ORAL_TABLET | Freq: Four times a day (QID) | ORAL | Status: DC

## 2023-01-26 MED ORDER — ZOLPIDEM TARTRATE 5 MG PO TABS: 5.0000 mg | ORAL_TABLET | Freq: Every evening | ORAL | Status: DC | PRN

## 2023-01-26 MED ORDER — DIPHENHYDRAMINE HCL 50 MG/ML IJ SOLN
12.5000 mg | INTRAMUSCULAR | Status: DC | PRN
Start: 2023-01-26 — End: 2023-01-26

## 2023-01-26 MED ORDER — CHLORHEXIDINE GLUCONATE 0.12 % MT SOLN
OROMUCOSAL | Status: AC
  Filled 2023-01-26: qty 15

## 2023-01-26 MED ORDER — AMISULPRIDE (ANTIEMETIC) 5 MG/2ML IV SOLN: 10.0000 mg | Freq: Once | INTRAVENOUS | Status: DC | PRN

## 2023-01-26 MED ORDER — CEFAZOLIN SODIUM-DEXTROSE 2-3 GM-%(50ML) IV SOLR
INTRAVENOUS | Status: DC | PRN
  Administered 2023-01-26: 2 g via INTRAVENOUS

## 2023-01-26 MED ORDER — DEXMEDETOMIDINE HCL IN NACL 80 MCG/20ML IV SOLN
INTRAVENOUS | Status: AC
Start: 2023-01-26 — End: ?
  Filled 2023-01-26: qty 20

## 2023-01-26 MED ORDER — KETOROLAC TROMETHAMINE 30 MG/ML IJ SOLN
30.0000 mg | Freq: Four times a day (QID) | INTRAMUSCULAR | Status: DC | PRN
Start: 2023-01-26 — End: 2023-01-26

## 2023-01-26 MED ORDER — SIMETHICONE 80 MG PO CHEW: 80.0000 mg | CHEWABLE_TABLET | ORAL | Status: DC | PRN

## 2023-01-26 MED ORDER — DIPHENHYDRAMINE HCL 50 MG/ML IJ SOLN
INTRAMUSCULAR | Status: DC | PRN
  Administered 2023-01-26: 12.5 mg via INTRAVENOUS

## 2023-01-26 MED ORDER — FAMOTIDINE 20 MG PO TABS
ORAL_TABLET | ORAL | Status: AC
  Filled 2023-01-26: qty 1

## 2023-01-26 MED ORDER — LACTATED RINGERS IV SOLN: INTRAVENOUS | Status: AC

## 2023-01-26 MED ORDER — COCONUT OIL OIL: 1.0000 | TOPICAL_OIL | Status: DC | PRN

## 2023-01-26 MED ORDER — SENNOSIDES-DOCUSATE SODIUM 8.6-50 MG PO TABS
2.0000 | ORAL_TABLET | Freq: Every day | ORAL | Status: DC
  Administered 2023-01-27 – 2023-01-29 (×2): 2 via ORAL
  Filled 2023-01-26 (×3): qty 2

## 2023-01-26 MED ORDER — CEFAZOLIN SODIUM-DEXTROSE 2-4 GM/100ML-% IV SOLN
INTRAVENOUS | Status: AC
  Filled 2023-01-26: qty 100

## 2023-01-26 MED ORDER — FENTANYL CITRATE (PF) 100 MCG/2ML IJ SOLN
INTRAMUSCULAR | Status: DC | PRN
  Administered 2023-01-26: 15 ug via INTRATHECAL

## 2023-01-26 MED ORDER — OXYTOCIN-SODIUM CHLORIDE 30-0.9 UT/500ML-% IV SOLN
INTRAVENOUS | Status: DC | PRN
  Administered 2023-01-26: 300 mL via INTRAVENOUS

## 2023-01-26 MED ORDER — MORPHINE SULFATE (PF) 0.5 MG/ML IJ SOLN
INTRAMUSCULAR | Status: DC | PRN
  Administered 2023-01-26: 150 ug via INTRATHECAL

## 2023-01-26 MED ORDER — KETOROLAC TROMETHAMINE 30 MG/ML IJ SOLN
30.0000 mg | Freq: Four times a day (QID) | INTRAMUSCULAR | Status: AC
  Administered 2023-01-26 – 2023-01-27 (×4): 30 mg via INTRAVENOUS
  Filled 2023-01-26 (×4): qty 1

## 2023-01-26 MED ORDER — ONDANSETRON HCL 4 MG/2ML IJ SOLN
4.0000 mg | Freq: Three times a day (TID) | INTRAMUSCULAR | Status: DC | PRN
  Filled 2023-01-26: qty 2

## 2023-01-26 MED ORDER — SCOPOLAMINE 1 MG/3DAYS TD PT72: 1.0000 | MEDICATED_PATCH | Freq: Once | TRANSDERMAL | Status: DC

## 2023-01-26 MED ORDER — PHENYLEPHRINE HCL-NACL 20-0.9 MG/250ML-% IV SOLN
INTRAVENOUS | Status: AC
Start: 2023-01-26 — End: ?
  Filled 2023-01-26: qty 250

## 2023-01-26 MED ORDER — NALOXONE HCL 0.4 MG/ML IJ SOLN: 0.4000 mg | INTRAMUSCULAR | Status: DC | PRN

## 2023-01-26 MED ORDER — SCOPOLAMINE 1 MG/3DAYS TD PT72
MEDICATED_PATCH | TRANSDERMAL | Status: AC
Start: 2023-01-26 — End: ?
  Filled 2023-01-26: qty 1

## 2023-01-26 MED ORDER — STERILE WATER FOR IRRIGATION IR SOLN
Status: DC | PRN
  Administered 2023-01-26: 1000 mL

## 2023-01-26 MED ORDER — ACETAMINOPHEN 500 MG PO TABS
1000.0000 mg | ORAL_TABLET | Freq: Four times a day (QID) | ORAL | Status: DC
  Administered 2023-01-26 – 2023-01-28 (×8): 1000 mg via ORAL
  Filled 2023-01-26 (×13): qty 2

## 2023-01-26 MED ORDER — SIMETHICONE 80 MG PO CHEW
80.0000 mg | CHEWABLE_TABLET | Freq: Three times a day (TID) | ORAL | Status: DC
  Administered 2023-01-27 – 2023-01-29 (×5): 80 mg via ORAL
  Filled 2023-01-26 (×6): qty 1

## 2023-01-26 MED ORDER — ONDANSETRON HCL 4 MG/2ML IJ SOLN
INTRAMUSCULAR | Status: DC | PRN
  Administered 2023-01-26: 4 mg via INTRAVENOUS

## 2023-01-26 MED ORDER — ORAL CARE MOUTH RINSE: 15.0000 mL | Freq: Once | OROMUCOSAL | Status: AC

## 2023-01-26 MED ORDER — SCOPOLAMINE 1 MG/3DAYS TD PT72
MEDICATED_PATCH | TRANSDERMAL | Status: AC
  Filled 2023-01-26: qty 1

## 2023-01-26 MED ORDER — WITCH HAZEL-GLYCERIN EX PADS: 1.0000 | MEDICATED_PAD | CUTANEOUS | Status: DC | PRN

## 2023-01-26 MED ORDER — ACETAMINOPHEN 10 MG/ML IV SOLN
INTRAVENOUS | Status: DC | PRN
  Administered 2023-01-26: 1000 mg via INTRAVENOUS

## 2023-01-26 MED ORDER — FAMOTIDINE 20 MG PO TABS
20.0000 mg | ORAL_TABLET | Freq: Once | ORAL | Status: AC
  Administered 2023-01-26: 20 mg via ORAL

## 2023-01-26 MED ORDER — OXYCODONE HCL 5 MG PO TABS
5.0000 mg | ORAL_TABLET | ORAL | Status: DC | PRN
  Administered 2023-01-26: 5 mg via ORAL
  Filled 2023-01-26: qty 1

## 2023-01-26 MED ORDER — PHENYLEPHRINE HCL-NACL 20-0.9 MG/250ML-% IV SOLN
INTRAVENOUS | Status: DC | PRN
  Administered 2023-01-26: 60 ug/min via INTRAVENOUS

## 2023-01-26 MED ORDER — OXYTOCIN-SODIUM CHLORIDE 30-0.9 UT/500ML-% IV SOLN: 2.5000 [IU]/h | INTRAVENOUS | Status: AC

## 2023-01-26 MED ORDER — DIBUCAINE (PERIANAL) 1 % EX OINT: 1.0000 | TOPICAL_OINTMENT | CUTANEOUS | Status: DC | PRN

## 2023-01-26 MED ORDER — TRANEXAMIC ACID-NACL 1000-0.7 MG/100ML-% IV SOLN
INTRAVENOUS | Status: DC | PRN
  Administered 2023-01-26: 1000 mg via INTRAVENOUS

## 2023-01-26 MED ORDER — FENTANYL CITRATE (PF) 100 MCG/2ML IJ SOLN
INTRAMUSCULAR | Status: DC | PRN
  Administered 2023-01-26: 85 ug via INTRAVENOUS

## 2023-01-26 MED ORDER — CHLORHEXIDINE GLUCONATE 0.12 % MT SOLN
15.0000 mL | Freq: Once | OROMUCOSAL | Status: AC
  Administered 2023-01-26: 15 mL via OROMUCOSAL

## 2023-01-26 MED ORDER — DEXTROSE 5 % IV SOLN: 500.0000 mg | INTRAVENOUS | Status: DC

## 2023-01-26 MED ORDER — NALOXONE HCL 4 MG/10ML IJ SOLN: 1.0000 ug/kg/h | INTRAVENOUS | Status: DC | PRN

## 2023-01-26 MED ORDER — MENTHOL 3 MG MT LOZG: 1.0000 | LOZENGE | OROMUCOSAL | Status: DC | PRN

## 2023-01-26 MED ORDER — DIPHENHYDRAMINE HCL 25 MG PO CAPS
25.0000 mg | ORAL_CAPSULE | ORAL | Status: DC | PRN
Start: 2023-01-26 — End: 2023-01-26

## 2023-01-26 MED ORDER — DEXAMETHASONE SODIUM PHOSPHATE 10 MG/ML IJ SOLN
INTRAMUSCULAR | Status: AC
  Filled 2023-01-26: qty 1

## 2023-01-26 MED ORDER — SOD CITRATE-CITRIC ACID 500-334 MG/5ML PO SOLN
30.0000 mL | Freq: Once | ORAL | Status: AC
  Administered 2023-01-26: 30 mL via ORAL

## 2023-01-26 MED ORDER — FENTANYL CITRATE (PF) 100 MCG/2ML IJ SOLN
25.0000 ug | INTRAMUSCULAR | Status: DC | PRN
  Administered 2023-01-26: 50 ug via INTRAVENOUS

## 2023-01-26 MED ORDER — SOD CITRATE-CITRIC ACID 500-334 MG/5ML PO SOLN
ORAL | Status: AC
  Filled 2023-01-26: qty 30

## 2023-01-26 MED ORDER — ONDANSETRON HCL 4 MG/2ML IJ SOLN
4.0000 mg | Freq: Once | INTRAMUSCULAR | Status: DC | PRN
Start: 2023-01-26 — End: 2023-01-26

## 2023-01-26 MED ORDER — LACTATED RINGERS IV SOLN
INTRAVENOUS | Status: DC
Start: 2023-01-26 — End: 2023-01-26

## 2023-01-26 MED ORDER — DIPHENHYDRAMINE HCL 50 MG/ML IJ SOLN
INTRAMUSCULAR | Status: AC
  Filled 2023-01-26: qty 1

## 2023-01-26 MED ORDER — PRENATAL MULTIVITAMIN CH
1.0000 | ORAL_TABLET | Freq: Every day | ORAL | Status: DC
  Administered 2023-01-27 – 2023-01-29 (×3): 1 via ORAL
  Filled 2023-01-26 (×3): qty 1

## 2023-01-26 MED ORDER — ACETAMINOPHEN 10 MG/ML IV SOLN
INTRAVENOUS | Status: AC
  Filled 2023-01-26: qty 100

## 2023-01-26 MED ORDER — SCOPOLAMINE 1 MG/3DAYS TD PT72
1.0000 | MEDICATED_PATCH | Freq: Once | TRANSDERMAL | Status: DC
  Administered 2023-01-26: 1.5 mg via TRANSDERMAL

## 2023-01-26 MED ORDER — IBUPROFEN 600 MG PO TABS
600.0000 mg | ORAL_TABLET | Freq: Four times a day (QID) | ORAL | Status: DC
  Administered 2023-01-27 – 2023-01-29 (×7): 600 mg via ORAL
  Filled 2023-01-26 (×7): qty 1

## 2023-01-26 SURGICAL SUPPLY — 33 items
BENZOIN TINCTURE PRP APPL 2/3 (GAUZE/BANDAGES/DRESSINGS) ×1 IMPLANT
CHLORAPREP W/TINT 26 (MISCELLANEOUS) ×2 IMPLANT
CLAMP UMBILICAL CORD (MISCELLANEOUS) ×1 IMPLANT
CLOTH BEACON ORANGE TIMEOUT ST (SAFETY) ×1 IMPLANT
DRSG OPSITE POSTOP 4X10 (GAUZE/BANDAGES/DRESSINGS) ×1 IMPLANT
ELECT REM PT RETURN 9FT ADLT (ELECTROSURGICAL) ×1
ELECTRODE REM PT RTRN 9FT ADLT (ELECTROSURGICAL) ×1 IMPLANT
EXTRACTOR VACUUM M CUP 4 TUBE (SUCTIONS) IMPLANT
GLOVE BIO SURGEON STRL SZ7.5 (GLOVE) ×1 IMPLANT
GLOVE BIOGEL PI IND STRL 7.0 (GLOVE) ×1 IMPLANT
GLOVE BIOGEL PI IND STRL 7.5 (GLOVE) ×1 IMPLANT
GOWN STRL REUS W/TWL LRG LVL3 (GOWN DISPOSABLE) ×2 IMPLANT
KIT ABG SYR 3ML LUER SLIP (SYRINGE) IMPLANT
NDL HYPO 25X5/8 SAFETYGLIDE (NEEDLE) IMPLANT
NEEDLE HYPO 25X5/8 SAFETYGLIDE (NEEDLE) IMPLANT
NS IRRIG 1000ML POUR BTL (IV SOLUTION) ×1 IMPLANT
PACK C SECTION WH (CUSTOM PROCEDURE TRAY) ×1 IMPLANT
PAD OB MATERNITY 4.3X12.25 (PERSONAL CARE ITEMS) ×1 IMPLANT
RTRCTR C-SECT PINK 25CM LRG (MISCELLANEOUS) ×1 IMPLANT
SPONGE T-LAP 18X18 ~~LOC~~+RFID (SPONGE) IMPLANT
STRIP CLOSURE SKIN 1/2X4 (GAUZE/BANDAGES/DRESSINGS) ×1 IMPLANT
SUT CHROMIC 2 0 CT 1 (SUTURE) ×1 IMPLANT
SUT MNCRL 0 VIOLET CTX 36 (SUTURE) ×1 IMPLANT
SUT MNCRL AB 3-0 PS2 27 (SUTURE) ×1 IMPLANT
SUT PLAIN 2 0 XLH (SUTURE) ×1 IMPLANT
SUT VIC AB 0 CT1 36 (SUTURE) ×1 IMPLANT
SUT VIC AB 0 CTX 36 (SUTURE) ×3
SUT VIC AB 0 CTX36XBRD ANBCTRL (SUTURE) ×3 IMPLANT
SUT VIC AB 2-0 SH 27 (SUTURE)
SUT VIC AB 2-0 SH 27XBRD (SUTURE) IMPLANT
TOWEL OR 17X24 6PK STRL BLUE (TOWEL DISPOSABLE) ×1 IMPLANT
TRAY FOLEY W/BAG SLVR 14FR LF (SET/KITS/TRAYS/PACK) ×1 IMPLANT
WATER STERILE IRR 1000ML POUR (IV SOLUTION) ×1 IMPLANT

## 2023-01-26 NOTE — Lactation Note (Signed)
This note was copied from a baby's chart. Lactation Consultation Note  Patient Name: Shirley Gutierrez [id number] Date: 01/26/2023 Age:24 hours Reason for consult: Follow-up assessment;Late-preterm 34-36.6wks;Maternal endocrine disorder  P2- RN requested for LC to demonstrate how to use the new colostrum collectors since infant was sent to the NICU tonight. LC set up the new colostrum collectors on the DEBP and demonstrated how they worked. LC also demonstrated how to take them apart without spilling the colostrum. MOB verbalized understanding and denied having any questions or concerns. LC encouraged MOB to call for further assistance as needed. MOB is currently using a 21 mm flange, but needs an 18 mm flange. Currently we have none in the supply room, so 21 mm flange is the best size for this moment.  Maternal Data Does the patient have breastfeeding experience prior to this delivery?: Yes  Feeding Mother's Current Feeding Choice: Breast Milk and Formula Nipple Type: Slow - flow  Lactation Tools Discussed/Used Tools: Pump;Flanges Flange Size: 21 Breast pump type: Double-Electric Breast Pump;Manual Pump Education: Setup, frequency, and cleaning;Milk Storage Reason for Pumping: Infant in NICU Pumping frequency: 15-20 min every 2-3 hrs  Interventions Interventions: Breast feeding basics reviewed;DEBP;Education;LC Services brochure  Discharge Discharge Education: Warning signs for feeding baby Pump: DEBP;Personal  Consult Status Consult Status: NICU follow-up Date: 01/27/23 Follow-up type: In-patient    Dema Severin BS, IBCLC 01/26/2023, 10:26 PM

## 2023-01-26 NOTE — Progress Notes (Signed)
Pt meets criteria for Pre-Eclampsia without severe features UPCR 0.32 and BPs in mild range Notified RN to call for SBP > 160 or DBP > 110 or any sxs. Will cont to observe for now

## 2023-01-26 NOTE — Anesthesia Postprocedure Evaluation (Signed)
Anesthesia Post Note  Patient: Shirley Gutierrez  Procedure(s) Performed: CESAREAN SECTION (Abdomen)     Patient location during evaluation: PACU Anesthesia Type: Spinal Level of consciousness: awake, awake and alert and oriented Pain management: pain level controlled Vital Signs Assessment: post-procedure vital signs reviewed and stable Respiratory status: spontaneous breathing, nonlabored ventilation and respiratory function stable Cardiovascular status: blood pressure returned to baseline and stable Postop Assessment: no headache, no backache, spinal receding and no apparent nausea or vomiting Anesthetic complications: no   No notable events documented.  Last Vitals:  Vitals:   01/26/23 1530 01/26/23 1558  BP: 131/69 123/61  Pulse: (!) 55 (!) 58  Resp: 14   Temp:  37.2 C  SpO2:  97%    Last Pain:  Vitals:   01/26/23 1600  TempSrc:   PainSc: 0-No pain   Pain Goal:                   Collene Schlichter

## 2023-01-26 NOTE — Transfer of Care (Signed)
Immediate Anesthesia Transfer of Care Note  Patient: Shirley Gutierrez  Procedure(s) Performed: CESAREAN SECTION (Abdomen)  Patient Location: PACU  Anesthesia Type:Spinal  Level of Consciousness: awake  Airway & Oxygen Therapy: Patient Spontanous Breathing  Post-op Assessment: Report given to RN and Post -op Vital signs reviewed and stable  Post vital signs: Reviewed and stable  Last Vitals:  Vitals Value Taken Time  BP    Temp    Pulse    Resp    SpO2      Last Pain:  Vitals:   01/26/23 1056  TempSrc: Oral  PainSc: 0-No pain         Complications: No notable events documented.

## 2023-01-26 NOTE — Lactation Note (Addendum)
This note was copied from a baby's chart. Lactation Consultation Note  Patient Name: Girl Eulah Frutos [id number] Date: 01/26/2023 Age:24 hours  Reason for consult: Initial assessment;Maternal endocrine disorder;Late-preterm 34-36.6wks;Breastfeeding assistance  P2, [redacted]w[redacted]d, LPI (BW 9 lb 2 oz)  Initial LC visit to see P2 mother with LPI, LGA and Type II diabetes on insulin. Discussed the LPI feeding plan and pumping was initiated. Mother very tired and will need reinforcement of teaching. She pumped on one breast briefly for demonstration and education. Assisted mother with hand expression and a few drops collected. Baby was given EBM on a gloved finger. She demonstrated a strong suck and rooting. Baby guided to breast and baby latched for a few sucks. Baby is having intermittent grunting that NP/ RN is aware of. Baby was repositioned to mother's midline of her chest for skin to skin.  Discussed LPI feeding behaviors and feeding plan:  breastfeed with feeding cues and when baby is actively sucking/ feeding at breast. Stop when baby shows fatigue. Limit breastfeeding/ effort to 15 minutes, less if baby is not showing an interest. Supplement with mother's expressed milk and/or formula. Refer to feeding guidelines based on age/ weight left at bedside Pump for 15 min every 3 hours in initiation phase to stimulate milk production   Maternal Data Has patient been taught Hand Expression?: Yes Does the patient have breastfeeding experience prior to this delivery?: Yes How long did the patient breastfeed?: pumped for 6 months, baby would not latch  Feeding Mother's Current Feeding Choice: Breast Milk and Formula Nipple Type: Slow - flow  LATCH Score Latch: Repeated attempts needed to sustain latch, nipple held in mouth throughout feeding, stimulation needed to elicit sucking reflex.  Audible Swallowing: A few with stimulation  Type of Nipple: Everted at rest and after stimulation  Comfort  (Breast/Nipple): Soft / non-tender  Hold (Positioning): Full assist, staff holds infant at breast  LATCH Score: 6   Lactation Tools Discussed/Used Tools: Pump;Flanges Flange Size: 21 Breast pump type: Double-Electric Breast Pump Pump Education: Setup, frequency, and cleaning;Milk Storage Reason for Pumping: LPI, stim milk production Pumping frequency: every 3 hours for 15 min, initiate setting Pumped volume:  (pumped briefly, one side for demonstration)  Interventions Interventions: Breast feeding basics reviewed;Assisted with latch;Skin to skin;Hand express;Breast compression;Adjust position;Position options;DEBP;Education;LC Services brochure;LPT handout/interventions  Discharge Pump: DEBP;Personal  Consult Status Consult Status: Follow-up Date: 01/27/23 Follow-up type: In-patient    Christella Hartigan M 01/26/2023, 6:25 PM

## 2023-01-26 NOTE — Op Note (Signed)
Cesarean Section Procedure Note  Indications: P1 at 37wks with pregestational diabetes (uncontrolled) presenting for repeat c-section.  Pre-operative Diagnosis: 1.37wks 2.Past pregnancy history of cesarean section 3.Type 2 DM   Post-operative Diagnosis: 1.37wks 2.Past pregnancy history of cesarean section 3.Type 2 DM  Procedure: REPEAT LOW TRANSVERSE CESAREAN SECTION  Surgeon: Osborn Coho, MD    Assistants: Lorene Dy, surgical tech  Anesthesia: Regional  Anesthesiologist: Collene Schlichter, MD   Procedure Details  The patient was taken to the operating room secondary to h/o c-section after the risks, benefits, complications, treatment options, and expected outcomes were discussed with the patient.  The patient concurred with the proposed plan, giving informed consent which was signed and witnessed. The patient was taken to Operating Room A, identified as Shirley Gutierrez and the procedure verified as C-Section Delivery. A Time Out was held and the above information confirmed.  After induction of anesthesia by obtaining a spinal, the patient was prepped and draped in the usual sterile manner. A Pfannenstiel skin incision was made and carried down through the subcutaneous tissue to the underlying layer of fascia.  The fascia was incised bilaterally and extended transversely bilaterally with the Mayo scissors. Kocher clamps were placed on the inferior aspect of the fascial incision and the underlying rectus muscle was separated from the fascia. The same was done on the superior aspect of the fascial incision.  The peritoneum was identified, entered bluntly and extended manually.  An Alexis self-retaining retractor was placed.  The utero-vesical peritoneal reflection was incised transversely and the bladder flap was bluntly freed from the lower uterine segment. A low transverse uterine incision was made with the scalpel and extended bilaterally with the bandage scissors.  The infant was  delivered in vertex position without difficulty.  After the umbilical cord was clamped and cut, the infant was handed to the awaiting pediatricians.  Cord blood was obtained for evaluation.  The placenta was removed intact and appeared to be within normal limits. The uterus was cleared of all clots and debris. The uterine incision was closed with running interlocking sutures of 0 Vicryl and a second imbricating layer was performed as well.   Bilateral tubes and ovaries appeared to be within normal limits.  Good hemostasis was noted.  Copious irrigation was performed until clear.  The peritoneum was repaired with 2-0 chromic via a running suture.  The fascia was reapproximated with a running suture of 0 Vicryl.  The skin was reapproximated with a subcuticular suture of 3-0 monocryl.  Steristrips were applied.  Instrument, sponge, and needle counts were correct prior to abdominal closure and at the conclusion of the case.  The patient was awaiting transfer to the recovery room in good condition.  Findings: Live female infant with Apgars 9 at one minute and 9 at five minutes.  Normal appearing bilateral ovaries and fallopian tubes were noted.  Estimated Blood Loss:  663 ml         Drains: foley to gravity 500 cc         Total IV Fluids: 2000 ml         Specimens to Pathology: L&D         Complications:  None; patient tolerated the procedure well.         Disposition: PACU - hemodynamically stable.         Condition: stable  Attending Attestation: I performed the procedure.  I was present and scrubbed and the assistant was required due to complexity of anatomy.

## 2023-01-26 NOTE — H&P (Signed)
Shirley Gutierrez is a 22 y.o. female presenting for scheduled repeat c-section d/t uncontrolled pregestational diabetes and LGA.  OB History     Gravida  3   Para  1   Term  1   Preterm  0   AB  1   Living  1      SAB  1   IAB  0   Ectopic  0   Multiple  0   Live Births  1          Past Medical History:  Diagnosis Date   Diabetes mellitus without complication (HCC)    Type 2   Headache    Heart murmur    when she was a child   S/P VSD repair    As a child   Past Surgical History:  Procedure Laterality Date   CESAREAN SECTION  12/06/2020   Procedure: CESAREAN SECTION;  Surgeon: Charlett Nose, MD;  Location: [facility] LD ORS;  Service: Obstetrics;;   HEART CHAMBER REVISION     closed hole in heart when seh was 2 months old   OVARIAN CYST REMOVAL     VSD REPAIR     as a child   Family History: family history includes Cancer in her paternal aunt; Diabetes in her father, maternal grandfather, maternal grandmother, paternal grandfather, and paternal grandmother. Social History:  reports that she has never smoked. She has never used smokeless tobacco. She reports that she does not drink alcohol and does not use drugs.     Maternal Diabetes: Yes:  Diabetes Type:  Insulin/Medication controlled Genetic Screening: Declined Maternal Ultrasounds/Referrals: Normal Fetal Ultrasounds or other Referrals:  Fetal echo Maternal Substance Abuse:  No Significant Maternal Medications:  Meds include: Other: insulin Significant Maternal Lab Results:  Other: elevated LFTs resolved by late pregnancy Number of Prenatal Visits:greater than 3 verified prenatal visits Maternal Vaccinations:None documented Other Comments:  None  Review of Systems No F/C/N/V/D  History   Blood pressure (!) 151/78, pulse (!) 59, temperature 98.8 F (37.1 C), temperature source Oral, resp. rate 20, height 5\' 1"  (1.549 m), weight 87.5 kg, last menstrual period 05/13/2022, SpO2 100%,  currently breastfeeding. Exam Physical Exam  Lungs unlabored CV RRR Abdomen gravid, NT Extremities no calf tenderness  Efw 8lbs 15oz >99%, cephalic  Prenatal labs: ABO, Rh: --/--/O POS (11/15 1047) Antibody: NEG (11/15 1047) Rubella: Nonimmune (05/15 0000) RPR: NON REACTIVE (11/15 1043)  HBsAg: Negative (05/15 0000)  HIV: Non-reactive (05/15 0000)  GBS:   pending  Assessment/Plan: 22yo G3P1011 at 37wks presenting for repeat c-section.  Risks benefits and alternatives discussed with the patient including but not limited to bleeding infection and injury.  Questions answered and consent signed and witnessed.   Purcell Nails 01/26/2023, 11:47 AM

## 2023-01-26 NOTE — Progress Notes (Signed)
Patient will call for 2 hour postprandial CBG.

## 2023-01-26 NOTE — Anesthesia Preprocedure Evaluation (Addendum)
Anesthesia Evaluation  Patient identified by MRN, date of birth, ID band Patient awake    Reviewed: Allergy & Precautions, NPO status , Patient's Chart, lab work & pertinent test results  Airway Mallampati: III  TM Distance: >3 FB Neck ROM: Full    Dental  (+) Teeth Intact, Dental Advisory Given   Pulmonary neg pulmonary ROS   Pulmonary exam normal breath sounds clear to auscultation       Cardiovascular Normal cardiovascular exam Rhythm:Regular Rate:Normal  S/p VSD repair    Neuro/Psych  Headaches  negative psych ROS   GI/Hepatic negative GI ROS, Neg liver ROS,,,  Endo/Other  diabetes, Type 2, Insulin Dependent  Class 3 obesity  Renal/GU negative Renal ROS     Musculoskeletal negative musculoskeletal ROS (+)    Abdominal   Peds  Hematology  (+) Blood dyscrasia, anemia Plt 225k   Anesthesia Other Findings   Reproductive/Obstetrics (+) Pregnancy history of cesarean section                             Anesthesia Physical Anesthesia Plan  ASA: 3  Anesthesia Plan: Spinal   Post-op Pain Management:    Induction:   PONV Risk Score and Plan: 2 and Treatment may vary due to age or medical condition, Scopolamine patch - Pre-op, Dexamethasone and Ondansetron  Airway Management Planned: Natural Airway  Additional Equipment:   Intra-op Plan:   Post-operative Plan:   Informed Consent: I have reviewed the patients History and Physical, chart, labs and discussed the procedure including the risks, benefits and alternatives for the proposed anesthesia with the patient or authorized representative who has indicated his/her understanding and acceptance.     Dental advisory given  Plan Discussed with: CRNA  Anesthesia Plan Comments:        Anesthesia Quick Evaluation

## 2023-01-27 LAB — CBC
HCT: 25.5 % — ABNORMAL LOW (ref 36.0–46.0)
Hemoglobin: 8.1 g/dL — ABNORMAL LOW (ref 12.0–15.0)
MCH: 24.3 pg — ABNORMAL LOW (ref 26.0–34.0)
MCHC: 31.8 g/dL (ref 30.0–36.0)
MCV: 76.6 fL — ABNORMAL LOW (ref 80.0–100.0)
Platelets: 195 10*3/uL (ref 150–400)
RBC: 3.33 MIL/uL — ABNORMAL LOW (ref 3.87–5.11)
RDW: 14.6 % (ref 11.5–15.5)
WBC: 7.3 10*3/uL (ref 4.0–10.5)
nRBC: 0 % (ref 0.0–0.2)

## 2023-01-27 LAB — GLUCOSE, CAPILLARY
Glucose-Capillary: 93 mg/dL (ref 70–99)
Glucose-Capillary: 136 mg/dL — ABNORMAL HIGH (ref 70–99)
Glucose-Capillary: 152 mg/dL — ABNORMAL HIGH (ref 70–99)

## 2023-01-27 MED ORDER — SODIUM CHLORIDE 0.9 % IV SOLN
500.0000 mg | Freq: Once | INTRAVENOUS | Status: AC
  Administered 2023-01-27: 500 mg via INTRAVENOUS
  Filled 2023-01-27: qty 25

## 2023-01-27 MED ORDER — NIFEDIPINE ER OSMOTIC RELEASE 30 MG PO TB24
30.0000 mg | ORAL_TABLET | Freq: Every day | ORAL | Status: DC
  Administered 2023-01-27 – 2023-01-29 (×3): 30 mg via ORAL
  Filled 2023-01-27 (×3): qty 1

## 2023-01-27 NOTE — Progress Notes (Signed)
Instructed patient to call for 2 hour postprandial CBG.

## 2023-01-27 NOTE — Social Work (Signed)
Patient screened out for psychosocial assessment since none of the following apply: Psychosocial stressors documented in mother or baby's chart Gestation less than 32 weeks Code at delivery  Infant with anomalies Please contact the Clinical Social Worker if specific needs arise, by MOB's request, or if MOB scores greater than 9/yes to question 10 on Edinburgh Postpartum Depression Screen.  Vivi Barrack, MSW, LCSW Clinical Social Worker  [phone] 01/27/2023  10:21 AM

## 2023-01-27 NOTE — Progress Notes (Signed)
Went to check CBG at 1313 and patient was off the unit.

## 2023-01-27 NOTE — Lactation Note (Signed)
This note was copied from a baby's chart.  NICU Lactation Consultation Note  Patient Name: Girl Deajah Ille [id number] Date: 01/27/2023 Age:24 hours  Reason for consult: Follow-up assessment; NICU baby; Early term 24-38.6wks; Maternal endocrine disorder Type of Endocrine Disorder?: Diabetes (Type 2 DM on insulin) LGA infant  SUBJECTIVE  LC in to visit with P2 Mom of baby "Conni Elliot" who was transferred to NICU for RDS and desaturations.    Mom states she pumped once last night, reassured her not to expect to fill the bottle, but pumping on initiation setting will stimulate her milk producing hormones.  Encouraged STS with baby in the NICU.  Mom aware of lactation support available to her and encouraged to ask for help prn.  LC noted pump parts soaking.  Mom educated on the importance of washing, rinsing and air drying in separate bin provided.  Soaking limited to 5 mins maximum.   Mom aware of pump available in baby's room.  Encouraged her to take her pump parts to NICU.  OBJECTIVE Infant data: Mother's Current Feeding Choice: Breast Milk and Formula  O2 Device: HHFNC O2 Flow Rate (L/min): 2 L/min FiO2 (%): 21 %  Infant feeding assessment Scale for Readiness: 2   Maternal data: [id number] C-Section, Low Transverse Has patient been taught Hand Expression?: Yes Hand Expression Comments: expressed a few drops of colostrum Previous breastfeeding challenges?: Exclusive pump and bottle fed Does the patient have breastfeeding experience prior to this delivery?: Yes How long did the patient breastfeed?: 1 month Pumping frequency: Mom has pumped once, encouraged to pump every 3 hrs while baby is being supplemented in NICU Pumped volume: 0 mL Flange Size: 21  Pump: Hands Free, Personal, Manual, Refer for rental (Medela hand pump and a Willow hands free pump)  ASSESSMENT Infant: Latch: Too sleepy or reluctant, no latch achieved, no sucking elicited. Audible Swallowing:  None Type of Nipple: Everted at rest and after stimulation Comfort (Breast/Nipple): Soft / non-tender Hold (Positioning): Assistance needed to correctly position infant at breast and maintain latch. LATCH Score: 5  Feeding Status: Scheduled 9-12-3-6 Feeding method: Tube/Gavage (Bolus) Nipple Type: Slow - flow  Maternal: Milk volume: Normal  INTERVENTIONS/PLAN Interventions: Interventions: Breast feeding basics reviewed; Skin to skin; Breast massage; Hand express; DEBP Discharge Education: Warning signs for feeding baby Tools: Pump; Flanges Pump Education: Setup, frequency, and cleaning; Milk Storage  Plan: Consult Status: NICU follow-up NICU Follow-up type: Verify onset of copious milk; Verify absence of engorgement   Judee Clara 01/27/2023, 11:36 AM

## 2023-01-27 NOTE — Progress Notes (Signed)
Patient is due to go back to NICU at 1730 for a feeding and wants to wait until she gets back to start the Venofer.

## 2023-01-27 NOTE — Lactation Note (Signed)
This note was copied from a baby's chart.  NICU Lactation Consultation Note  Patient Name: Girl Tarsha Marcy [id number] Date: 01/27/2023 Age:24 hours  Reason for consult: Follow-up assessment; RN request; NICU baby; Early term 50-38.6wks; Maternal endocrine disorder; Breastfeeding assistance Type of Endocrine Disorder?: Diabetes (Type 2 DM on insulin)  SUBJECTIVE  LC in to assist with 6pm feeding.  Baby actively cueing.  Mom in recliner in laid back position.  Baby placed prone STS on Mom's chest and baby actively searching for nipple.   LC assisted with positioning and sandwiching of breast and baby able to attain a deep latch and suckle a couple times before popping off and fussing.  Tried a more upright cross cradle and then switched to football on right breast.  Baby able to attain a deep areolar latch, but unable to sustain at this time.  Baby fussy and placed STS on Mom's chest where baby settled down sucking on pacifier.  Gavage feeding of 30 ml 22 cal formula started after 5 mins of trying to latch to the breast.  Mom encouraged to pump after breastfeeding or attempts, and every 3 hrs/day and 4 hrs at  night.     OBJECTIVE Infant data: Mother's Current Feeding Choice: Breast Milk and Formula  O2 Device: Room Air O2 Flow Rate (L/min): 2 L/min FiO2 (%): 21 %  Infant feeding assessment Scale for Readiness: 2 Scale for Quality: 4 (infant latches briefly then lets nipple go)   Maternal data: [id number] C-Section, Low Transverse Has patient been taught Hand Expression?: Yes Hand Expression Comments: expressed a few drops of colostrum Previous breastfeeding challenges?: Exclusive pump and bottle fed Does the patient have breastfeeding experience prior to this delivery?: Yes How long did the patient breastfeed?: 1 month Pumping frequency: Mom encouraged to pump every 3 hrs during the day and 4 hrs at night Pumped volume: 0 mL (drop) Flange Size: 21  Pump: Hands Free,  Personal, Manual, Refer for rental (Medela hand pump and a Willow hands free pump)  ASSESSMENT Infant: Latch: Repeated attempts needed to sustain latch, nipple held in mouth throughout feeding, stimulation needed to elicit sucking reflex. Audible Swallowing: None Type of Nipple: Everted at rest and after stimulation (short nipples) Comfort (Breast/Nipple): Soft / non-tender Hold (Positioning): Full assist, staff holds infant at breast LATCH Score: 5  Feeding Status: Scheduled 9-12-3-6 Feeding method: Breast Nipple Type: Slow - flow  Maternal: Milk volume: Normal  INTERVENTIONS/PLAN Interventions: Interventions: Breast feeding basics reviewed; Assisted with latch; Skin to skin; Breast massage; Hand express; Adjust position; Support pillows; Position options; DEBP; Education Discharge Education: Warning signs for feeding baby Tools: Pump; Flanges Pump Education: Setup, frequency, and cleaning; Milk Storage  Plan: 1- STS with baby during feedings 2-offer the breast with feeding cues, asking for help prn 3-Pump both breasts on initiation setting every 3 hrs/day and 4 hrs/night 4- ask for lactation prn  Consult Status: NICU follow-up NICU Follow-up type: Verify onset of copious milk; Verify absence of engorgement   Judee Clara 01/27/2023, 6:17 PM

## 2023-01-27 NOTE — Progress Notes (Signed)
CBG 136 3 hours postprandial

## 2023-01-27 NOTE — Progress Notes (Signed)
Subjective: Postpartum Day 1: Cesarean Delivery Patient reports tolerating PO, + flatus, and no problems voiding.    Objective: Vital signs in last 24 hours: Temp:  [98.4 F (36.9 C)-99.3 F (37.4 C)] 98.6 F (37 C) (11/19 0735) Pulse Rate:  [50-62] 57 (11/19 0735) Resp:  [13-20] 20 (11/19 0735) BP: (123-165)/(60-83) 142/83 (11/19 0735) SpO2:  [97 %-100 %] 100 % (11/19 0735) Weight:  [87.5 kg] 87.5 kg (11/18 1056)    01/27/2023    7:35 AM 01/27/2023    4:01 AM 01/26/2023    7:32 PM  Vitals with BMI  Systolic 142 149 952  Diastolic 83 60 77  Pulse 57 50 58    Physical Exam:  General: alert, cooperative, and no distress Lochia: appropriate Uterine Fundus: firm Incision: with honey comb dressing, clean, dry and intact.  DVT Evaluation: No evidence of DVT seen on physical exam. Calf/Ankle edema is present.  Recent Labs    01/26/23 1612 01/27/23 0645  HGB 9.9* 8.1*  HCT 31.1* 25.5*   CBG (last 3)  Recent Labs    01/26/23 1121 01/26/23 2117 01/27/23 0538  GLUCAP 95 146* 93     Assessment/Plan: 22 y/o G3P112 POD # 1 s/p repeat Cesarean section. With poorly controlled pregestational diabetes mellitus, Doing well postoperatively.  With anemia, will treat with WIth elevated blood pressure, will treat with  Continue close monitoring of sugars.   Continue current care.  Prescilla Sours, MD 01/27/2023, 9:06 AM

## 2023-01-28 LAB — GLUCOSE, CAPILLARY
Glucose-Capillary: 112 mg/dL — ABNORMAL HIGH (ref 70–99)
Glucose-Capillary: 103 mg/dL — ABNORMAL HIGH (ref 70–99)
Glucose-Capillary: 100 mg/dL — ABNORMAL HIGH (ref 70–99)

## 2023-01-28 MED ORDER — ONDANSETRON HCL 4 MG PO TABS
4.0000 mg | ORAL_TABLET | Freq: Three times a day (TID) | ORAL | Status: DC | PRN
  Administered 2023-01-28: 4 mg via ORAL
  Filled 2023-01-28: qty 1

## 2023-01-28 MED ORDER — INSULIN GLARGINE-YFGN 100 UNIT/ML ~~LOC~~ SOLN
4.0000 [IU] | Freq: Every day | SUBCUTANEOUS | Status: DC
  Administered 2023-01-28: 4 [IU] via SUBCUTANEOUS
  Filled 2023-01-28 (×2): qty 0.04

## 2023-01-28 NOTE — Lactation Note (Signed)
This note was copied from a baby's chart.  NICU Lactation Consultation Note  Patient Name: Shirley Gutierrez [id number] Date: 01/28/2023 Age:24 hours  Reason for consult: Follow-up assessment; NICU baby; Early term 88-38.6wks; Maternal endocrine disorder; Other (Comment) (LGA) Type of Endocrine Disorder?: Diabetes (T2DM (insulin))  SUBJECTIVE Visited with family of 56 hours old ETI NICU female; Shirley Gutierrez is a P2 but doesn't have a lot of experience breastfeeding. She reports she has taken baby to breast 3-4 times and also doing some pumping, praised her for her efforts. She voiced that baby "Shirley Gutierrez" gets frustrated because her milk is not in yet; she's been getting bottles with Similac 22 calorie formula. Noticed that pumping hasn't been consistent. Explained the importance of consistent pumping for the onset of lactogenesis II and to protect her supply, since baby is not doing full feedings at the breast yet. Reviewed pumping schedule, pumping log, lactogenesis II, benefits of STS care and anticipatory guidelines.  OBJECTIVE Infant data: Mother's Current Feeding Choice: Breast Milk and Formula  O2 Device: Room Air O2 Flow Rate (L/min): 2 L/min FiO2 (%): 21 %  Infant feeding assessment Scale for Readiness: 1 Scale for Quality: 3   Maternal data: [id number] C-Section, Low Transverse Current breast feeding challenges:: NICU admission Previous breastfeeding challenges?: Exclusive pump and bottle fed How long did the patient breastfeed?: 1 month Pumping frequency: 2 times/24 hours Pumped volume: 0 mL (droplets) Flange Size: 21 Risk factor for low/delayed milk supply:: infant separation, infrequent pumping as 01/28/2023  Pump: Hands Free, Manual (Willow and Medela Hand pump)  ASSESSMENT Infant: Feeding Status: Scheduled 9-12-3-6 Feeding method: Bottle; Tube/Gavage (Bolus) Nipple Type: Dr. Irving Burton Preemie  Maternal: Milk volume:  Normal  INTERVENTIONS/PLAN Interventions: Interventions: Breast feeding basics reviewed; DEBP; Education; NICU Pumping Log Tools: Pump; Flanges Pump Education: Setup, frequency, and cleaning; Milk Storage  Plan: Encouraged to continue taking baby to breast on feeding cues around feeding times  STS care as much as possible Pumping every 3 hours, after feedings/attempts at the breast; ideally 8 pumping sessions/24 hours Parents will continue advancing on bottle feedings  FOB present and supportive. All questions and concerns answered, family to contact [facility] services PRN.  Consult Status: NICU follow-up NICU Follow-up type: Maternal D/C visit   Shirley Gutierrez Shirley Gutierrez 01/28/2023, 11:21 AM

## 2023-01-28 NOTE — Progress Notes (Signed)
Subjective: Postpartum Day 2: Cesarean Delivery Patient reports tolerating PO, + flatus, + BM, and no problems voiding.  Pt had a mild HA this morning in her left forehead but it resolved after eating and that's why pt feels she had the HA.  She had not been eating much.  Objective: Vital signs in last 24 hours: Temp:  [98.3 F (36.8 C)-98.6 F (37 C)] 98.3 F (36.8 C) (11/20 0512) Pulse Rate:  [60-63] 60 (11/20 0512) Resp:  [18-20] 18 (11/20 0512) BP: (127-146)/(65-79) 146/79 (11/20 0512) SpO2:  [98 %-99 %] 98 % (11/20 0512)  Physical Exam:  General: alert, cooperative, and no distress Lochia: appropriate Uterine Fundus: FF, NT Incision: dressing with dime size stain of dark blood DVT Evaluation: no calf tenderness, 1+-2+ edema  Recent Labs    01/26/23 1612 01/27/23 0645  HGB 9.9* 8.1*  HCT 31.1* 25.5*    Assessment/Plan: Status post Cesarean section. Postoperative course complicated by Pre-E without severe features and T2DM   Continue current care. T2DM - CBGs mildly elevated and just started eating better again today.  Will restart lantus at 4u at bedtime this evening. Pre-eclampsia without severe features - BPs mildly elevated started on procardia xl 30mg .  Intermittent SBP in the 140s but mostly ok.  BP now 136/63. Will have BP checked again at 6pm.  Precautions reviewed. Order placed for strict Is/Os. Pumping Baby in NICU d/t decreasing O2 sats but improving now per pt.  Purcell Nails, MD 01/28/2023, 3:03 PM

## 2023-01-28 NOTE — Progress Notes (Signed)
Patient has not had breakfast yet and will call for 2 hour postprandial CBG.

## 2023-01-29 ENCOUNTER — Ambulatory Visit (HOSPITAL_COMMUNITY): Payer: Self-pay

## 2023-01-29 LAB — COMPREHENSIVE METABOLIC PANEL
ALT: 22 U/L (ref 0–44)
AST: 54 U/L — ABNORMAL HIGH (ref 15–41)
Albumin: 2.1 g/dL — ABNORMAL LOW (ref 3.5–5.0)
Alkaline Phosphatase: 110 U/L (ref 38–126)
Anion gap: 9 (ref 5–15)
BUN: <5 mg/dL — ABNORMAL LOW (ref 6–20)
CO2: 21 mmol/L — ABNORMAL LOW (ref 22–32)
Calcium: 8.4 mg/dL — ABNORMAL LOW (ref 8.9–10.3)
Chloride: 107 mmol/L (ref 98–111)
Creatinine, Ser: 0.65 mg/dL (ref 0.44–1.00)
GFR, Estimated: >60 mL/min (ref >60)
Glucose, Bld: 82 mg/dL (ref 70–99)
Potassium: 3.4 mmol/L — ABNORMAL LOW (ref 3.5–5.1)
Sodium: 137 mmol/L (ref 135–145)
Total Bilirubin: 0.3 mg/dL (ref <1.2)
Total Protein: 5.1 g/dL — ABNORMAL LOW (ref 6.5–8.1)

## 2023-01-29 LAB — CBC
HCT: 26.6 % — ABNORMAL LOW (ref 36.0–46.0)
Hemoglobin: 8.3 g/dL — ABNORMAL LOW (ref 12.0–15.0)
MCH: 24.1 pg — ABNORMAL LOW (ref 26.0–34.0)
MCHC: 31.2 g/dL (ref 30.0–36.0)
MCV: 77.3 fL — ABNORMAL LOW (ref 80.0–100.0)
Platelets: 193 10*3/uL (ref 150–400)
RBC: 3.44 MIL/uL — ABNORMAL LOW (ref 3.87–5.11)
RDW: 15.1 % (ref 11.5–15.5)
WBC: 7 10*3/uL (ref 4.0–10.5)
nRBC: 0 % (ref 0.0–0.2)

## 2023-01-29 LAB — GLUCOSE, CAPILLARY: Glucose-Capillary: 92 mg/dL (ref 70–99)

## 2023-01-29 MED ORDER — NIFEDIPINE ER 30 MG PO TB24: 30.0000 mg | ORAL_TABLET | Freq: Every day | ORAL | 0 refills | Status: AC

## 2023-01-29 MED ORDER — POLYSACCHARIDE IRON COMPLEX 150 MG PO CAPS: 150.0000 mg | ORAL_CAPSULE | ORAL | 0 refills | Status: DC

## 2023-01-29 MED ORDER — INSULIN GLARGINE 100 UNIT/ML ~~LOC~~ SOLN: 4.0000 [IU] | Freq: Every day | SUBCUTANEOUS | 0 refills | Status: AC

## 2023-01-29 MED ORDER — OXYCODONE HCL 5 MG PO TABS: 5.0000 mg | ORAL_TABLET | ORAL | 0 refills | Status: DC | PRN

## 2023-01-29 MED ORDER — POLYSACCHARIDE IRON COMPLEX 150 MG PO CAPS: 150.0000 mg | ORAL_CAPSULE | ORAL | Status: DC

## 2023-01-29 MED ORDER — IBUPROFEN 600 MG PO TABS: 600.0000 mg | ORAL_TABLET | Freq: Four times a day (QID) | ORAL | 0 refills | Status: DC

## 2023-01-29 MED ORDER — MEASLES, MUMPS & RUBELLA VAC IJ SOLR: 0.5000 mL | Freq: Once | INTRAMUSCULAR | Status: DC

## 2023-01-29 NOTE — Discharge Instructions (Addendum)
  Shirley Gutierrez,   1. While at home remember to walk regularly, at least 1 hour a day, as this will help with your quick recovery. 2. Do not do any heavy lifting, i.e nothing heavier than 15 lbs for the next 6 weeks 3.  Do not use tampons or douche or take baths, do not have any sexual intercourse or anything inside the vagina for the next 6 weeks.  4. Take your pain medication as needed for pain, let us know if the pain is not well controlled despite pain medication use.  5. Get plenty of rest especially for the next two weeks to allow your body to recover.  You may feel tired in the process, this is normal. 6. Take your iron tablets daily for anemia.  You may also take a stool softener e.g colace if you are constipated.    7.  If you get a fever while at home, check your temperature and if it is equal to or greater than 100.4 please call the office.   8.  Please keep your upcoming appointment at the offices as scheduled.  9.  Remove the Honey comb dressing one week from your surgery date or on date noted on the dressing. To remove the honey comb dressing, first take a shower.  After showering pat the dressing dry then peel it off gently from the corners.  After honey comb comes out there will be steri strips left on the incision.  You may continue showering as usual and the steri strips may get wet.  After showering pat the steri strips dry. Allow the steri strips to fall off by themselves over time.  Do not rub the incision directly or put soap directly over the incision.  Always rinse off any soap over the incision and pat the incision dry.  10.  Some vaginal bleeding is expected and normal after your surgery. Please let us know if if it excessive where you saturate 1 pad in less than 2 hours or so.  12. Check your glucose levels once every morning before breakfast and bring your numbers to the office at the next visit to review them.   [facility] [phone].

## 2023-01-29 NOTE — Lactation Note (Signed)
This note was copied from a baby's chart.  NICU Lactation Consultation Note  Patient Name: Shirley Gutierrez Date: 01/29/2023 Age:24 hours  Reason for consult: Follow-up assessment; NICU baby; Maternal endocrine disorder; Other (Comment) (LGA) Type of Endocrine Disorder?: Diabetes (T2DM (insulin))  SUBJECTIVE Visited with family of 67 hours old ETI NICU female; Ms. Butler Denmark is a P2 and was trying to latch baby on when entered the room. This LC and LC Terra assisted with latching and positioning since she was actively cueing, with and without a NS # 20 but "Shirley Gutierrez" kept slipping off the breast; she would do a few sucks but would get frustrated when the flow stopped, even though mother's milk is in already. Baby stayed at the breast for a total of 15 minutes on and off, she required a lot of soothing since she wouldn't stop crying until she finally fell asleep (see LATCH score).  Noticed that Shirley Gutierrez has a natal tooth but so far is not causing pain/discomfort during breastfeeding. Ms. Butler Denmark reported pumping is going well. Noticed that she's still not pumping consistently; breast felt knotty and firm (see maternal assessment). She is getting discharged today. Reviewed discharge education and the importance of consistent pumping around baby's feeding schedule for the prevention of engorgement and to protect her supply.   OBJECTIVE Infant data: Mother's Current Feeding Choice: Breast Milk and Formula  O2 Device: Room Air  Infant feeding assessment Scale for Readiness: 2 Scale for Quality: 3   Maternal data: [id number] C-Section, Low Transverse Current breast feeding challenges:: NICU admission Pumping frequency: 3 times/24 hours Pumped volume: 35 mL Flange Size: 21 Hands-free pumping top sizes: Small/Medium (Blue) Risk factor for low/delayed milk supply:: infant separation, infrequent pumping as 01/28/2023  Pump: Hands Free, Manual (Willow and Medela Hand  pump)  ASSESSMENT Infant: Latch: Repeated attempts needed to sustain latch, nipple held in mouth throughout feeding, stimulation needed to elicit sucking reflex. Audible Swallowing: A few with stimulation Type of Nipple: Everted at rest and after stimulation Comfort (Breast/Nipple): Soft / non-tender Hold (Positioning): Assistance needed to correctly position infant at breast and maintain latch. LATCH Score: 7  Feeding Status: Scheduled 9-12-3-6 Feeding method: Breast Nipple Type: Dr. Irving Burton Preemie  Maternal: Milk volume: Normal No S/S of engorgement yet, but breast are filling in.  INTERVENTIONS/PLAN Interventions: Interventions: Breast feeding basics reviewed; Assisted with latch; Skin to skin; Breast compression; Adjust position; Support pillows; DEBP; Education Discharge Education: Engorgement and breast care Tools: Pump; Flanges; Hands-free pumping top; Nipple Dorris Carnes Pump Education: Setup, frequency, and cleaning; Milk Storage Nipple shield size: 20  Plan: Encouraged to continue taking baby to breast on feeding cues around feeding times using NS # 20 PRN STS care as much as possible Pumping every 3 hours on maintain mode, after feedings/attempts at the breast; ideally 8 pumping sessions/24 hours Parents will take all pump parts to baby's room after her discharge  Parents will continue advancing on bottle feedings   FOB present and supportive. All questions and concerns answered, family to contact [facility] services PRN.   Consult Status: NICU follow-up NICU Follow-up type: Verify absence of engorgement; Weekly NICU follow up   Kairav Russomanno S Philis Nettle 01/29/2023, 11:14 AM

## 2023-01-29 NOTE — Progress Notes (Signed)
Patient undecided on getting MMR vaccine. Will let RN know when she decides.

## 2023-01-29 NOTE — Discharge Summary (Signed)
Postpartum Discharge Summary    Patient Name: Shirley Gutierrez DOB: 2000-04-14 MRN: [id number]  Date of admission: 01/26/2023 Delivery date:01/26/2023 Delivering provider: Osborn Coho Date of discharge: 01/29/2023  Admitting diagnosis: Status post repeat low transverse cesarean section [Z98.891] Personal history of surgery to heart and great vessels, presenting hazards to health [Z98.890] Intrauterine pregnancy: [redacted]w[redacted]d     Secondary diagnosis:  Principal Problem:   Status post repeat low transverse cesarean section Active Problems:   Personal history of surgery to heart and great vessels, presenting hazards to health  Additional problems: Preeclampsia without severe features, Iron deficiency anemia in pregnancy.    Discharge diagnosis: Term Pregnancy Delivered, Type 2 DM, and Anemia                                              Post partum procedures: None Augmentation: N/A Complications: None  Hospital course: Sceduled C/S   22 y.o. yo [id number] at [redacted]w[redacted]d was admitted to the hospital 01/26/2023 for scheduled cesarean section with the following indication:Elective Repeat.Delivery details are as follows:  Membrane Rupture Time/Date: 1:09 PM,01/26/2023  Delivery Method:C-Section, Low Transverse Operative Delivery:N/A Details of operation can be found in separate operative note.  Patient had a postpartum course complicated by Hgb of 8.1 for which she received IV iron infusion.  She was found to have Preeclampsia without severe features and Procardia was started for blood pressure control.  She had some elevated glucose values after delivery and she was restarted on semglee insulin.  She is ambulating, tolerating a regular diet, passing flatus, and urinating well. Patient is discharged home in stable condition on  01/29/23        Newborn Data: Birth date:01/26/2023 Birth time:1:10 PM Gender:Female Living status:Living Apgars:9 ,9  Weight:4160 g   Baby was taken to the NICU  due to respiratory issues after delivery.   Magnesium Sulfate received: No BMZ received: No Rhophylac:N/A MMR:No -declined.  T-DaP:No Flu: No RSV Vaccine received: No  Immunizations received: There is no immunization history for the selected administration types on file for this patient.  Physical exam  Vitals:   01/28/23 1802 01/28/23 2002 01/28/23 2148 01/29/23 0629  BP: (!) 129/54 138/71 133/78 127/67  Pulse: 64 64  63  Resp:  18  17  Temp:  98.4 F (36.9 C)  98 F (36.7 C)  TempSrc:  Oral  Oral  SpO2:  100%  100%  Weight:      Height:       General: alert, cooperative, and no distress Lochia: appropriate Uterine Fundus: firm Incision: Honey comb dressing with small dried blood, otherwise is clean, dry and intact.  DVT Evaluation: No evidence of DVT seen on physical exam. Calf/Ankle edema is present Labs: Lab Results  Component Value Date   WBC 7.0 01/29/2023   HGB 8.3 (L) 01/29/2023   HCT 26.6 (L) 01/29/2023   MCV 77.3 (L) 01/29/2023   PLT 193 01/29/2023      Latest Ref Rng & Units 01/29/2023    4:46 AM  CMP  Glucose 70 - 99 mg/dL 82   BUN 6 - 20 mg/dL <5   Creatinine 2.95 - 1.00 mg/dL 2.84   Sodium 132 - 440 mmol/L 137   Potassium 3.5 - 5.1 mmol/L 3.4   Chloride 98 - 111 mmol/L 107   CO2 22 - 32 mmol/L 21  Calcium 8.9 - 10.3 mg/dL 8.4   Total Protein 6.5 - 8.1 g/dL 5.1   Total Bilirubin <8.7 mg/dL 0.3   Alkaline Phos 38 - 126 U/L 110   AST 15 - 41 U/L 54   ALT 0 - 44 U/L 22    CBG (last 3)  Recent Labs    01/28/23 1704 01/28/23 2118 01/29/23 0634  GLUCAP 103* 100* 92    01/26/23: Urine protein creatinine ratio: 0.32.   Edinburgh Score:    01/26/2023    4:00 PM  Edinburgh Postnatal Depression Scale Screening Tool  I have been able to laugh and see the funny side of things. 0  I have looked forward with enjoyment to things. 0  I have blamed myself unnecessarily when things went wrong. 1  I have been anxious or worried for no good  reason. 1  I have felt scared or panicky for no good reason. 0  Things have been getting on top of me. 0  I have been so unhappy that I have had difficulty sleeping. 0  I have felt sad or miserable. 0  I have been so unhappy that I have been crying. 1  The thought of harming myself has occurred to me. 0  Edinburgh Postnatal Depression Scale Total 3   No data recorded  After visit meds:  Allergies as of 01/29/2023       Reactions   Promethazine Itching, Nausea Only, Other (See Comments)   Drowsy.        Medication List     TAKE these medications    ibuprofen 600 MG tablet Commonly known as: ADVIL Take 1 tablet (600 mg total) by mouth every 6 (six) hours.   insulin glargine 100 UNIT/ML injection Commonly known as: LANTUS Inject 0.04 mLs (4 Units total) into the skin at bedtime. What changed: how much to take   NIFEdipine 30 MG 24 hr tablet Commonly known as: ADALAT CC Take 1 tablet (30 mg total) by mouth daily for 40 doses. Start taking on: January 30, 2023   oxyCODONE 5 MG immediate release tablet Commonly known as: Oxy IR/ROXICODONE Take 1-2 tablets (5-10 mg total) by mouth every 4 (four) hours as needed for moderate pain (pain score 4-6).   prenatal multivitamin Tabs tablet Take 1 tablet by mouth daily at 12 noon.   Vitamin D3 50 MCG (2000 UT) capsule Take 2,000 Units by mouth daily.       Discharge home in stable condition Infant Feeding: Bottle and Breast Infant Disposition:NICU Discharge instruction: per After Visit Summary and Postpartum booklet. Activity: Advance as tolerated. Pelvic rest for 6 weeks.  Diet: carb modified diet, iron rich foods.  Future Appointments:No future appointments. Follow up Visit:  Follow-up Information     [facility], [facility]. Schedule an appointment as soon as possible for a visit in 1 week(s).   Specialty: Obstetrics and Gynecology Why: 1 week for blood pressure check and  6 weeks for postpartum check. Contact  information: [address] [place] [address] [phone]                Anticipated Birth Control:  Unsure.  01/29/2023 Prescilla Sours, MD

## 2023-01-31 ENCOUNTER — Ambulatory Visit (HOSPITAL_COMMUNITY): Payer: Self-pay

## 2023-01-31 NOTE — Lactation Note (Signed)
This note was copied from a baby's chart.  NICU Lactation Consultation Note  Patient Name: Shirley Gutierrez ZOXWR'U Date: 01/31/2023 Age:22 years  Reason for consult: Follow-up assessment; NICU baby; Early term 89-38.6wks; Maternal endocrine disorder; RN request Type of Endocrine Disorder?: Diabetes  SUBJECTIVE  RN requested LC come to talk with P2 Mom of "Conni Elliot".  Baby is ad lib bottle feeding and Mom has decided to give baby formula.  Mom is still pumping, supply is increasing, but she is not pumping consistently.  Mom states that baby was acting gassy and having explosive stools when breastfeeding and on breast milk.  Mom switched baby to formula and he seems to be more settled.    LC talked about the rare case where a baby can not tolerate Mom's breast milk.   Mom denies any spitting or reflux symptoms.  Mom educated on how breast milk is digested easier and baby's stool more frequently and is looser like diarrhea, as it is digested easier.  LC talked about foods in Mom's diet to limit, dairy can cause more gastric upset than any other foods.    Mom states baby will latch, but she is preferring bottle feeding breast milk at this point.  LC offered to assist/assess baby at the breast prn and to have RN call LC prn.  OBJECTIVE Infant data: No data recorded O2 Device: Room Air  Infant feeding assessment Scale for Readiness: 1 Scale for Quality: 2   Maternal data: [id number] C-Section, Low Transverse Pumping frequency: 4-5 times per 24 hrs Pumped volume: 45 mL Flange Size: 21 Hands-free pumping top sizes: Small/Medium (Blue)  Pump: Hands Free, Manual (Willow and Medela Hand pump)  Feeding Status: Ad lib Feeding method: Bottle Nipple Type: Dr. Irving Burton Preemie  Maternal: Milk volume: Low  INTERVENTIONS/PLAN Interventions: Interventions: Breast feeding basics reviewed; Skin to skin; Breast massage; Hand express; DEBP Tools: Pump; Flanges; Bottle Pump Education:  Setup, frequency, and cleaning; Milk Storage  Plan: Consult Status: NICU follow-up NICU Follow-up type: Baby's discharge   Judee Clara 01/31/2023, 4:24 PM

## 2023-02-04 ENCOUNTER — Ambulatory Visit (HOSPITAL_COMMUNITY): Payer: Self-pay

## 2023-02-04 ENCOUNTER — Telehealth (HOSPITAL_COMMUNITY): Payer: Self-pay

## 2023-02-04 NOTE — Telephone Encounter (Signed)
02/04/2023 1025  Name: Shirley Gutierrez MRN: [id number] DOB: 04-18-2000  Reason for Call:  Transition of Care Hospital Discharge Call  Contact Status: Patient Contact Status: Complete  Language assistant needed:          Follow-Up Questions: Do You Have Any Concerns About Your Health As You Heal From Delivery?: No Do You Have Any Concerns About Your Infants Health?: Infant in NICU  Edinburgh Postnatal Depression Scale:  In the Past 7 Days: I have been able to laugh and see the funny side of things.: As much as I always could I have looked forward with enjoyment to things.: As much as I ever did I have blamed myself unnecessarily when things went wrong.: Not very often I have been anxious or worried for no good reason.: No, not at all I have felt scared or panicky for no good reason.: No, not at all Things have been getting on top of me.: No, I have been coping as well as ever I have been so unhappy that I have had difficulty sleeping.: Not at all I have felt sad or miserable.: Not very often I have been so unhappy that I have been crying.: Only occasionally The thought of harming myself has occurred to me.: Never Edinburgh Postnatal Depression Scale Total: 3  PHQ2-9 Depression Scale:     Discharge Follow-up: Edinburgh score requires follow up?: No Patient was advised of the following resources:: Breastfeeding Support Group, Support Group  Post-discharge interventions: NA  Signature  Signe Colt

## 2023-02-04 NOTE — Lactation Note (Addendum)
This note was copied from a baby's chart.  NICU Lactation Consultation Note  Patient Name: Shirley Gutierrez ZOXWR'U Date: 02/04/2023 Age:24 days  Reason for consult: Follow-up assessment; NICU baby; Early term 87-38.6wks; RN request  SUBJECTIVE Consulted with P2 mom of 9 day old [redacted]w[redacted]d infant feeding now on a schedule. Mom reports that pumping occurs at least 6-7 times per 24 hours with a volume of 30-60ml expressed, but noticed infant is primarily receiving formula in addition to there being one bottle of expressed breast milk stored in the room refrigerator. Mom reports understanding use of pump, milk storage and importance of pumping frequency to build milk supply. Infant actively being paced-bottle fed formula by mom while LC was in room. Mom removed bottle to offer a burp. Infant burped immediately. Discussed milk production strategies, i.e. breast massage, hand expression and use of pumping bra to aid in optimal volume. Encouraged mom to power pump in the morning and to begin waking up overnight to pump in order to increase supply. Mom verbalized understanding.  OBJECTIVE Infant data: Mother's Current Feeding Choice: Breast Milk and Formula  O2 Device: Room Air  Infant feeding assessment Scale for Readiness: 1 Scale for Quality: 3 (fatigued quickly)   Maternal data: [id number] C-Section, Low Transverse Has patient been taught Hand Expression?: Yes Current breast feeding challenges:: mom reports infant is not gaining while on exclusive breast milk; supplementing per provider orders Pumping frequency: reports pumping 6-7 times per 24 hours Pumped volume: 30 mL (30-51ml combined) Flange Size: 21 Hands-free pumping top sizes: Large Wallace Cullens) Risk factor for low/delayed milk supply:: infrequent breast stimulation; infant not latching to breast  Pump: DEBP  ASSESSMENT Infant:  Feeding Status: Scheduled 8-11-2-5 Feeding method: Bottle Nipple Type: Dr. Irving Burton  Preemie  Maternal: Milk volume: Low  INTERVENTIONS/PLAN Interventions: Interventions: Breast feeding basics reviewed; Education; Breast massage Tools: Pump; Flanges Pump Education: Milk Storage; Setup, frequency, and cleaning  Plan: 1.Continue to pump 8 times in 24 hours 2. Try power pumping in the morning and waking up overnight between 2am-4am to pump a regular session. 3. Encourage use of breast massage and hand expression as able  No support person present. Mom to call for lactation support PRN.  Consult Status: Follow-up   Su Grand 02/04/2023, 11:56 AM

## 2023-02-17 ENCOUNTER — Ambulatory Visit (HOSPITAL_COMMUNITY): Payer: Self-pay

## 2023-02-17 NOTE — Lactation Note (Signed)
This note was copied from a baby's chart.  NICU Lactation Consultation Note  Patient Name: Shirley Gutierrez [id number] Date: 02/17/2023 Age:24 wk.o.  Reason for consult: Weekly NICU follow-up; NICU baby  SUBJECTIVE P2 mom consulted for baby's discharge home. Mom reports that her milk supply is normal and she is pumping frequently. Mom agreed to an outpatient lactation referral to be placed. She has the [facility] services brochure and all other educational hand outs.   OBJECTIVE Infant data: Mother's Current Feeding Choice: Breast Milk and Formula  O2 Device: Room Air  Infant feeding assessment Scale for Readiness: 1 Scale for Quality: 2   Maternal data: [id number] C-Section, Low Transverse Pumping frequency: patient reports frequent pumping; consulted in hallway for d/c Infant was discharging and LC [place] [facility] [place] discussed pumping with parents when they were being discharged and walking in the hallway. Pump: Personal (Willow and Medela Hand pump)  ASSESSMENT Infant:   Feeding Status: Ad lib Feeding method: Bottle Nipple Type: Dr. Irving Burton Preemie  Maternal: Milk volume: Normal (unable to assess; patient reports normal volume)  INTERVENTIONS/PLAN Interventions: Interventions: Breast feeding basics reviewed; Education; [employer] brochure Discharge Education: Outpatient Epic message sent; Outpatient recommendation  Plan: Consult Status: Complete   Su Grand 02/17/2023, 11:53 AM

## 2024-01-29 ENCOUNTER — Inpatient Hospital Stay (HOSPITAL_COMMUNITY)
Admission: AD | Admit: 2024-01-29 | Discharge: 2024-01-29 | Disposition: A | Discharge status: Home / Self Care | Payer: MEDICAID | Attending: Obstetrics and Gynecology | Admitting: Obstetrics and Gynecology

## 2024-01-29 ENCOUNTER — Encounter (HOSPITAL_COMMUNITY): Payer: Self-pay

## 2024-01-29 ENCOUNTER — Inpatient Hospital Stay (HOSPITAL_COMMUNITY): Payer: MEDICAID

## 2024-01-29 DIAGNOSIS — O034 Incomplete spontaneous abortion without complication: Secondary | ICD-10-CM | POA: Insufficient documentation

## 2024-01-29 DIAGNOSIS — D649 Anemia, unspecified: Secondary | ICD-10-CM | POA: Insufficient documentation

## 2024-01-29 DIAGNOSIS — O209 Hemorrhage in early pregnancy, unspecified: Secondary | ICD-10-CM

## 2024-01-29 DIAGNOSIS — O039 Complete or unspecified spontaneous abortion without complication: Secondary | ICD-10-CM

## 2024-01-29 LAB — WET PREP, GENITAL
Clue Cells Wet Prep HPF POC: NONE SEEN
Sperm: NONE SEEN
Trich, Wet Prep: NONE SEEN
WBC, Wet Prep HPF POC: <10 (ref <10)
Yeast Wet Prep HPF POC: NONE SEEN

## 2024-01-29 LAB — URINALYSIS, ROUTINE W REFLEX MICROSCOPIC
Bilirubin Urine: NEGATIVE
Glucose, UA: >=500 mg/dL — AB
Ketones, ur: NEGATIVE mg/dL
Leukocytes,Ua: NEGATIVE
Nitrite: NEGATIVE
Protein, ur: NEGATIVE mg/dL
Specific Gravity, Urine: 1.036 — ABNORMAL HIGH (ref 1.005–1.030)
pH: 6 (ref 5.0–8.0)

## 2024-01-29 LAB — CBC
HCT: 23.2 % — ABNORMAL LOW (ref 36.0–46.0)
Hemoglobin: 7.8 g/dL — ABNORMAL LOW (ref 12.0–15.0)
MCH: 27.9 pg (ref 26.0–34.0)
MCHC: 33.6 g/dL (ref 30.0–36.0)
MCV: 82.9 fL (ref 80.0–100.0)
Platelets: 239 K/uL (ref 150–400)
RBC: 2.8 MIL/uL — ABNORMAL LOW (ref 3.87–5.11)
RDW: 13.2 % (ref 11.5–15.5)
WBC: 6.9 K/uL (ref 4.0–10.5)
nRBC: 0 % (ref 0.0–0.2)

## 2024-01-29 MED ORDER — OXYCODONE HCL 5 MG PO CAPS: 5.0000 mg | ORAL_CAPSULE | Freq: Four times a day (QID) | ORAL | 0 refills | Status: AC | PRN

## 2024-01-29 MED ORDER — LACTATED RINGERS IV BOLUS
1000.0000 mL | Freq: Once | INTRAVENOUS | Status: AC
  Administered 2024-01-29: 1000 mL via INTRAVENOUS

## 2024-01-29 MED ORDER — ONDANSETRON HCL 4 MG PO TABS: 8.0000 mg | ORAL_TABLET | Freq: Every day | ORAL | 0 refills | Status: AC | PRN

## 2024-01-29 MED ORDER — MISOPROSTOL 200 MCG PO TABS: 800.0000 ug | ORAL_TABLET | Freq: Once | ORAL | 0 refills | Status: AC

## 2024-01-29 MED ORDER — FERRIC MALTOL 30 MG PO CAPS: 1.0000 | ORAL_CAPSULE | Freq: Two times a day (BID) | ORAL | 2 refills | Status: AC

## 2024-01-29 MED ORDER — METHYLERGONOVINE MALEATE 0.2 MG PO TABS: 0.2000 mg | ORAL_TABLET | Freq: Four times a day (QID) | ORAL | 0 refills | Status: AC

## 2024-01-29 MED ORDER — HYDROMORPHONE HCL 1 MG/ML IJ SOLN
1.0000 mg | Freq: Once | INTRAMUSCULAR | Status: AC
  Administered 2024-01-29: 1 mg via INTRAVENOUS
  Filled 2024-01-29: qty 1

## 2024-01-29 NOTE — MAU Note (Signed)
 Shirley Gutierrez is a 23 y.o. at Unknown here in MAU reporting: went in last Tuesday for US  11/13 and could not find a heartbeat. Excessive VB started Tuesday.  HA currently  LMP:  Onset of complaint: Tuesday Pain score: 5-6/10 ab 7/10 HA Vitals:   01/29/24 1437  BP: 120/79  Pulse: 99  Resp: 15  Temp: 98.2 F (36.8 C)  SpO2: 99%     FHT: na  Lab orders placed from triage:

## 2024-01-29 NOTE — MAU Provider Note (Addendum)
 Chief Complaint:  Vaginal Bleeding   HPI   Shirley Gutierrez is a 23 y.o. [id number] at [redacted]w[redacted]d who presents to maternity admissions reporting worsening bleeding with miscarriage.  She reports she was seen by her outpatient provider on Tuesday 11/13 for an ultrasound where she was diagnosed with a miscarriage by size less than dates and no fetal heart beat.  She reports she began having vaginal bleeding that day, she reports she has had increasing vaginal bleeding since then which has worsened over the past 2 days with passage of large clots.  She has been feeling weak and had some nausea as well as mild abdominal pain.  No chest pain or dyspnea.  Pregnancy Course: Patient reports diagnosed with miscarriage on 11/13  Past Medical History:  Diagnosis Date   Diabetes mellitus without complication (HCC)    Type 2   Headache    Heart murmur    when she was a child   S/P VSD repair    As a child   OB History  Gravida Para Term Preterm AB Living  4 2 1 1 1 2   SAB IAB Ectopic Multiple Live Births  1 0 0 0 2    # Outcome Date GA Lbr Len/2nd Weight Sex Type Anes PTL Lv  4 Current           3 Preterm 01/26/23 [redacted]w[redacted]d  4160 g F CS-LTranv Spinal  LIV  2 Term 12/06/20 [redacted]w[redacted]d  3915 g CHRISTELLA Duran EPI  LIV  1 SAB 08/29/19 104w2d          Past Surgical History:  Procedure Laterality Date   CESAREAN SECTION  12/06/2020   Procedure: CESAREAN SECTION;  Surgeon: Diedre Rosaline BRAVO, MD;  Location: [facility];  Service: Obstetrics;;   CESAREAN SECTION N/A 01/26/2023   Procedure: CESAREAN SECTION;  Surgeon: Henry Slough, MD;  Location: [facility];  Service: Obstetrics;  Laterality: N/A;   HEART CHAMBER REVISION     closed hole in heart when seh was 2 months old   OVARIAN CYST REMOVAL     VSD REPAIR     as a child   Family History  Problem Relation Age of Onset   Diabetes Father    Cancer Paternal Aunt    Diabetes Maternal Grandmother    Diabetes Maternal Grandfather    Diabetes Paternal  Grandmother    Diabetes Paternal Grandfather    Social History   Tobacco Use   Smoking status: Never   Smokeless tobacco: Never  Vaping Use   Vaping status: Never Used  Substance Use Topics   Alcohol use: Never   Drug use: Never   Allergies  Allergen Reactions   Promethazine  Itching, Nausea Only and Other (See Comments)    Drowsy.   No medications prior to admission.    I have reviewed patient's Past Medical Hx, Surgical Hx, Family Hx, Social Hx, medications and allergies.   ROS  Pertinent items noted in HPI and remainder of comprehensive ROS otherwise negative.   PHYSICAL EXAM  Patient Vitals for the past 24 hrs:  BP Temp Temp src Pulse Resp SpO2 Height Weight  01/29/24 1816 (!) 104/47 97.9 F (36.6 C) Oral 86 16 97 % -- --  01/29/24 1502 121/66 98.9 F (37.2 C) Oral 95 17 100 % -- --  01/29/24 1437 120/79 98.2 F (36.8 C) Oral 99 15 99 % 5' 1 (1.549 m) 75.8 kg    Constitutional: Well-developed, well-nourished female in no acute distress.  Cardiovascular: normal rate & rhythm, warm and well-perfused Respiratory: normal effort, no problems with respiration noted GI: Abd soft, non-tender, gravid MS: Extremities nontender, no edema, normal ROM Neurologic: Alert and oriented x 4.  Pelvic: 2-3 cm dilated cervical os visualized on speculum exam.  Significant clots in vaginal blood noted in the vaginal vault, removed with a swab.  No active bleeding from the os once clots were removed.  Chaperone: Jeanetta Bellamy, RN    Labs: Results for orders placed or performed during the hospital encounter of 01/29/24 (from the past 24 hours)  Urinalysis, Routine w reflex microscopic -Urine, Random     Status: Abnormal   Collection Time: 01/29/24  3:28 PM  Result Value Ref Range   Color, Urine YELLOW YELLOW   APPearance CLEAR CLEAR   Specific Gravity, Urine 1.036 (H) 1.005 - 1.030   pH 6.0 5.0 - 8.0   Glucose, UA >=500 (A) NEGATIVE mg/dL   Hgb urine dipstick LARGE (A) NEGATIVE    Bilirubin Urine NEGATIVE NEGATIVE   Ketones, ur NEGATIVE NEGATIVE mg/dL   Protein, ur NEGATIVE NEGATIVE mg/dL   Nitrite NEGATIVE NEGATIVE   Leukocytes,Ua NEGATIVE NEGATIVE   RBC / HPF 11-20 0 - 5 RBC/hpf   WBC, UA 0-5 0 - 5 WBC/hpf   Bacteria, UA RARE (A) NONE SEEN   Squamous Epithelial / HPF 0-5 0 - 5 /HPF   Mucus PRESENT   CBC     Status: Abnormal   Collection Time: 01/29/24  3:55 PM  Result Value Ref Range   WBC 6.9 4.0 - 10.5 K/uL   RBC 2.80 (L) 3.87 - 5.11 MIL/uL   Hemoglobin 7.8 (L) 12.0 - 15.0 g/dL   HCT 76.7 (L) 63.9 - 53.9 %   MCV 82.9 80.0 - 100.0 fL   MCH 27.9 26.0 - 34.0 pg   MCHC 33.6 30.0 - 36.0 g/dL   RDW 86.7 88.4 - 84.4 %   Platelets 239 150 - 400 K/uL   nRBC 0.0 0.0 - 0.2 %  Wet prep, genital     Status: None   Collection Time: 01/29/24  4:57 PM   Specimen: Urine, Random  Result Value Ref Range   Yeast Wet Prep HPF POC NONE SEEN NONE SEEN   Trich, Wet Prep NONE SEEN NONE SEEN   Clue Cells Wet Prep HPF POC NONE SEEN NONE SEEN   WBC, Wet Prep HPF POC <10 <10   Sperm NONE SEEN     Imaging:  US  OB LESS THAN 14 WEEKS WITH OB TRANSVAGINAL Result Date: 01/29/2024 CLINICAL DATA:  Vaginal bleeding during pregnancy EXAM: OBSTETRIC <14 WK US  AND TRANSVAGINAL OB US  TECHNIQUE: Both transabdominal and transvaginal ultrasound examinations were performed for complete evaluation of the gestation as well as the maternal uterus, adnexal regions, and pelvic cul-de-sac. Transvaginal technique was performed to assess early pregnancy. COMPARISON:  None Available. FINDINGS: Intrauterine gestational sac: None Endometrium: The endometrium in the body and fundus appears normal. Within the lower uterine segment endometrium there is a focal collection with echogenic fluid fluid level measuring 1.5 by 1.3 cm. Maternal uterus/adnexae: The right and left maternal ovaries are visualized and appear within normal limits. There is no pelvic free fluid. IMPRESSION: 1. No intrauterine gestational  sac identified. No adnexal mass or free fluid. There is a complex fluid collection in the lower uterine segment endometrium measuring 1.5 cm. In the setting of a positive pregnancy test findings are concerning for abortion in progress. Early or normal IUP and occult ectopic  pregnancy are much less likely, but remain in the differential. Recommend close interval follow-up. Electronically Signed   By: Greig Pique M.D.   On: 01/29/2024 17:29    MDM & MAU COURSE  MDM:  High  Physical exam completed with pelvic and speculum   Labs ordered and reviewed, notable for microcytic anemia acute on chronic Ultrasound notable for complex fluid collection in the lower uterine segment endometrium measuring 1.5 cm, overall most consistent with miscarriage with retained products of conception.  I personally spoke with Dr Claven ( MAU OB Attending)regarding management due to MAB in progress with rPOC  and Hgb of 7.8  and she deferred to Dr Armond [employer] Private Attending)   I spoke with Dr Armond [facility] Private Attending) directly and she recommended the patient be discharged home with Cytotec  800 mg for retained POC, Methergine  0.2mg  q 6 x 24 hours  and Pain management and she will schedule her to f/u in the office in 1 week. I also prescribed Accufer for her Anemia   MAU Course: Orders Placed This Encounter  Procedures   Wet prep, genital   US  OB LESS THAN 14 WEEKS WITH OB TRANSVAGINAL   CBC   Urinalysis, Routine w reflex microscopic -Urine, Random   Discharge patient Discharge disposition: 01-Home or Self Care; Discharge patient date: 01/29/2024   Meds ordered this encounter  Medications   lactated ringers  bolus 1,000 mL   HYDROmorphone  (DILAUDID ) injection 1 mg   Ferric Maltol  30 MG CAPS    Sig: Take 1 capsule (30 mg total) by mouth 2 (two) times daily. Please take one hour before breakfast and dinner    Dispense:  60 capsule    Refill:  2    Supervising Provider:   PRATT, TANYA S [[id number]]    misoprostol  (CYTOTEC ) 200 MCG tablet    Sig: Take 4 tablets (800 mcg total) by mouth once for 1 dose. Place all 4 tabs on the side of your cheek and let them dissolve    Dispense:  4 tablet    Refill:  0    Supervising Provider:   PRATT, TANYA S [[id number]]   oxycodone  (OXY-IR) 5 MG capsule    Sig: Take 1 capsule (5 mg total) by mouth every 6 (six) hours as needed for up to 3 days for pain.    Dispense:  12 capsule    Refill:  0    Supervising Provider:   PRATT, TANYA S [[id number]]   ondansetron  (ZOFRAN ) 4 MG tablet    Sig: Take 2 tablets (8 mg total) by mouth daily as needed for nausea or vomiting.    Dispense:  10 tablet    Refill:  0    Supervising Provider:   PRATT, TANYA S [[id number]]   methylergonovine (METHERGINE ) 0.2 MG tablet    Sig: Take 1 tablet (0.2 mg total) by mouth 4 (four) times daily for 1 day.    Dispense:  4 tablet    Refill:  0    Supervising Provider:   PRATT, TANYA S [[id number]]    I have reviewed the patient chart and performed the physical exam . I have ordered & interpreted the lab results and reviewed and interpreted the OB transvaginal ultrasound and agree with the radiology findings  Medications ordered as stated below.  A/P as described below.  Counseling and education provided and patient agreeable  with plan as described below. Verbalized understanding.     Reassured patient that miscarriage is common with ~  1/4 of women experiencing it in their lifetime. Reassured patient that there is nothing she did or did not do to cause this. Reviewed most common reason is presumed to be genetic abnormalities that allow a pregnancy to start but not continue past an early stage, but realistically we do not know the cause in most cases. Reviewed that studies show no definite difference between attempting another pregnancy sooner vs waiting, though some studies do show better live birth outcomes with trying sooner. Reviewed options of expectant, medical, or surgical management. After counseling  she elected for medical management. Rx sent for misoprostol  800 mcg buccal, zofran  ODT, and 4 tabs of 5mg  Oxycodone . Reviewed that cramping, bleeding are normal in the first few hours after taking the medication, but should eventually wane. Reviewed warning signs of heavy vaginal bleeding soaking through >1 pad per hour, crescendo abdominal pain, and fever. . Blood type  , rhogam was not indicated.  We discussed return precautions including crescendo abdominal pain, heavy vaginal bleeding soaking >1 pad/hour, and fever.   ASSESSMENT   1. Retained products of conception after miscarriage   2. Anemia, unspecified type   3. Vaginal bleeding affecting early pregnancy   4. Miscarriage at 8 to [redacted] weeks gestation     PLAN    Follow-up Information     [facility], [place] Follow up in 1 week(s).   Specialty: Obstetrics and Gynecology Why: Miscarriage follow-up Contact information: [address] [place] [address] [phone]               Care transition to Olam Dalton, NP prior to determination of disposition.   Olam Dalton, MSN, [facility], Center for [facility]  Allergies as of 01/29/2024       Reactions   Promethazine  Itching, Nausea Only, Other (See Comments)   Drowsy.        Medication List     STOP taking these medications    ibuprofen  600 MG tablet Commonly known as: ADVIL    iron  polysaccharides 150 MG capsule Commonly known as: NIFEREX   oxyCODONE  5 MG immediate release tablet Commonly known as: Oxy IR/ROXICODONE  Replaced by: oxycodone  5 MG capsule       TAKE these medications    Ferric Maltol  30 MG Caps Take 1 capsule (30 mg total) by mouth 2 (two) times daily. Please take one hour before breakfast and dinner   insulin  glargine 100 UNIT/ML injection Commonly known as: LANTUS  Inject 0.04 mLs (4 Units total) into the skin at bedtime.   methylergonovine 0.2 MG tablet Commonly known as:  METHERGINE  Take 1 tablet (0.2 mg total) by mouth 4 (four) times daily for 1 day.   misoprostol  200 MCG tablet Commonly known as: Cytotec  Take 4 tablets (800 mcg total) by mouth once for 1 dose. Place all 4 tabs on the side of your cheek and let them dissolve   NIFEdipine  30 MG 24 hr tablet Commonly known as: ADALAT  CC Take 1 tablet (30 mg total) by mouth daily for 40 doses.   ondansetron  4 MG tablet Commonly known as: Zofran  Take 2 tablets (8 mg total) by mouth daily as needed for nausea or vomiting.   oxycodone  5 MG capsule Commonly known as: OXY-IR Take 1 capsule (5 mg total) by mouth every 6 (six) hours as needed for up to 3 days for pain. Replaces: oxyCODONE  5 MG immediate release tablet   prenatal multivitamin Tabs tablet Take 1 tablet by mouth daily at 12 noon.  Vitamin D3 50 MCG (2000 UT) capsule Take 2,000 Units by mouth daily.          Olam Dalton, MSN, [facility], Center for [employer]

## 2024-02-01 LAB — GC/CHLAMYDIA PROBE AMP (~~LOC~~) NOT AT ARMC
Chlamydia: NEGATIVE
Comment: NEGATIVE
Comment: NORMAL
Neisseria Gonorrhea: NEGATIVE

## 2024-02-10 ENCOUNTER — Other Ambulatory Visit: Payer: Self-pay | Admitting: Obstetrics and Gynecology

## 2024-02-11 ENCOUNTER — Other Ambulatory Visit (HOSPITAL_COMMUNITY): Payer: Self-pay | Admitting: Pharmacist

## 2024-02-11 ENCOUNTER — Other Ambulatory Visit: Payer: Self-pay | Admitting: Obstetrics and Gynecology

## 2024-02-11 DIAGNOSIS — D500 Iron deficiency anemia secondary to blood loss (chronic): Secondary | ICD-10-CM | POA: Insufficient documentation

## 2024-02-15 ENCOUNTER — Telehealth: Payer: Self-pay

## 2024-02-15 ENCOUNTER — Other Ambulatory Visit (HOSPITAL_COMMUNITY): Payer: Self-pay

## 2024-02-15 NOTE — Telephone Encounter (Signed)
 MonoFerric PAP enrollment in progress

## 2024-02-15 NOTE — Addendum Note (Signed)
 Addended by: DAYNE SHERRY RAMAN on: 02/15/2024 10:11 AM   Modules accepted: Orders

## 2024-03-01 ENCOUNTER — Telehealth: Payer: Self-pay | Admitting: Pharmacy Technician

## 2024-03-01 NOTE — Telephone Encounter (Signed)
 Patient is un-insured and will need PAP. [employer] has made several attempts to contact the patient since 02/19/24.    Auth Submission: PENDING Site of care: Site of care: [facility] Payer: free drug Medication & CPT/J Code(s) submitted: Monoferric (Ferrci derisomaltose) [phone] Diagnosis Code:  Route of submission (phone, fax, portal):  Phone # Fax # Auth type: free drug Units/visits requested:  Reference number:  Approval from:  to

## 2024-03-14 ENCOUNTER — Telehealth: Payer: Self-pay | Admitting: Pharmacy Technician

## 2024-03-14 NOTE — Telephone Encounter (Signed)
 Unable to reach patient to sign consent forms and give household information. We have tried to contact the patient via phone, text, and MyChart message. If patient reaches back out, we can re-evaluate her enrollment application.

## 2024-03-14 NOTE — Telephone Encounter (Addendum)
 Auth Submission: APPROVED - FREE DRUG Site of care: Site of care: [facility] Payer: FREE DRUG Medication & CPT/J Code(s) submitted: Monoferric (Ferrci derisomaltose) [id number] Diagnosis Code:  Route of submission (phone, fax, portal):  Phone # Fax # Auth type: FREE DRUG Units/visits requested: X1 Reference number:  Approval from: 03/14/24 to 03/14/25

## 2024-04-01 ENCOUNTER — Ambulatory Visit: Payer: Self-pay

## 2024-04-01 VITALS — BP 110/71 | HR 75 | Temp 98.5°F | Resp 14 | Ht 61.0 in | Wt 161.8 lb

## 2024-04-01 DIAGNOSIS — D500 Iron deficiency anemia secondary to blood loss (chronic): Secondary | ICD-10-CM

## 2024-04-01 MED ORDER — SODIUM CHLORIDE 0.9 % IV SOLN
1000.0000 mg | Freq: Once | INTRAVENOUS | Status: AC
  Administered 2024-04-01: 1000 mg via INTRAVENOUS
  Filled 2024-04-01: qty 10

## 2024-04-01 NOTE — Progress Notes (Signed)
 Diagnosis:  Iron  Deficiency Anemia  Provider:  Praveen Mannam MD  Procedure: IV Infusion  IV Type: Peripheral, IV Location: L Antecubital   Monoferric  (Ferric Derisomaltose ), Dose: 1000 mg  Infusion Start Time: 1507  Infusion Stop Time: 1528  Post Infusion IV Care: Observation period completed and Peripheral IV Discontinued  Discharge: Condition: Good, Destination: Home . AVS Declined  Performed by:  Maximiano JONELLE Pouch, LPN

## 2024-04-04 ENCOUNTER — Other Ambulatory Visit (HOSPITAL_COMMUNITY): Payer: Self-pay
